# Patient Record
Sex: Female | Born: 1985 | Race: White | Hispanic: No | Marital: Married | Smoking: Never smoker
Health system: Southern US, Community
[De-identification: ages and names within clinical notes are randomized; demographics above are authoritative.]

## PROBLEM LIST (undated history)

## (undated) ENCOUNTER — Inpatient Hospital Stay (HOSPITAL_COMMUNITY): Payer: Self-pay

## (undated) DIAGNOSIS — Z789 Other specified health status: Secondary | ICD-10-CM

## (undated) DIAGNOSIS — O98212 Gonorrhea complicating pregnancy, second trimester: Secondary | ICD-10-CM

## (undated) HISTORY — PX: NO PAST SURGERIES: SHX2092

---

## 2009-10-04 ENCOUNTER — Inpatient Hospital Stay (HOSPITAL_COMMUNITY): Admission: AD | Admit: 2009-10-04 | Discharge: 2009-10-05 | Payer: Self-pay | Admitting: Obstetrics & Gynecology

## 2009-10-05 ENCOUNTER — Inpatient Hospital Stay (HOSPITAL_COMMUNITY): Admission: AD | Admit: 2009-10-05 | Discharge: 2009-10-07 | Payer: Self-pay | Admitting: Obstetrics and Gynecology

## 2010-04-23 LAB — CBC
HCT: 31.2 % — ABNORMAL LOW (ref 36.0–46.0)
Hemoglobin: 10.7 g/dL — ABNORMAL LOW (ref 12.0–15.0)
MCH: 31.2 pg (ref 26.0–34.0)
MCH: 31.7 pg (ref 26.0–34.0)
MCHC: 34 g/dL (ref 30.0–36.0)
MCV: 91.7 fL (ref 78.0–100.0)
Platelets: 154 10*3/uL (ref 150–400)
Platelets: 164 10*3/uL (ref 150–400)
RBC: 4.21 MIL/uL (ref 3.87–5.11)
RDW: 14.3 % (ref 11.5–15.5)

## 2010-04-23 LAB — RH IMMUNE GLOB WKUP(>/=20WKS)(NOT WOMEN'S HOSP): Fetal Screen: NEGATIVE

## 2011-01-10 ENCOUNTER — Other Ambulatory Visit (HOSPITAL_COMMUNITY): Payer: Self-pay | Admitting: Obstetrics & Gynecology

## 2011-01-10 DIAGNOSIS — Z369 Encounter for antenatal screening, unspecified: Secondary | ICD-10-CM

## 2011-02-03 ENCOUNTER — Encounter (HOSPITAL_COMMUNITY): Payer: Self-pay

## 2011-02-03 ENCOUNTER — Ambulatory Visit (HOSPITAL_COMMUNITY)
Admission: RE | Admit: 2011-02-03 | Discharge: 2011-02-03 | Disposition: A | Discharge status: Home / Self Care | Payer: BC Managed Care – PPO | Source: Ambulatory Visit | Attending: Obstetrics & Gynecology | Admitting: Obstetrics & Gynecology

## 2011-02-03 DIAGNOSIS — O3510X0 Maternal care for (suspected) chromosomal abnormality in fetus, unspecified, not applicable or unspecified: Secondary | ICD-10-CM | POA: Insufficient documentation

## 2011-02-03 DIAGNOSIS — Z3689 Encounter for other specified antenatal screening: Secondary | ICD-10-CM | POA: Insufficient documentation

## 2011-02-03 DIAGNOSIS — O351XX0 Maternal care for (suspected) chromosomal abnormality in fetus, not applicable or unspecified: Secondary | ICD-10-CM | POA: Insufficient documentation

## 2011-02-03 DIAGNOSIS — Z369 Encounter for antenatal screening, unspecified: Secondary | ICD-10-CM

## 2011-02-08 NOTE — L&D Delivery Note (Signed)
Pt arrived by EMS having delivered en route.  Pt reports she broke her water shortly after 4pm and labor progessed rapidly.  Pt's placenta also delivered intact.  She had a 2nd degree tear repaired with a 2-0 and a 3-0 vicryl.  Fundus was firm.  EBL estimated to be about 300cc.  Female infant weighing 7#6 had APGARs of 7,9.  Pt was GBS +, nursery was alerted pt did not receive abx prophylaxis.

## 2011-02-28 ENCOUNTER — Other Ambulatory Visit: Payer: Self-pay

## 2011-03-14 LAB — OB RESULTS CONSOLE RPR: RPR: NONREACTIVE

## 2011-03-14 LAB — OB RESULTS CONSOLE ABO/RH: RH Type: NEGATIVE

## 2011-07-19 LAB — OB RESULTS CONSOLE GBS: GBS: POSITIVE

## 2011-08-01 ENCOUNTER — Inpatient Hospital Stay (HOSPITAL_COMMUNITY)
Admission: AD | Admit: 2011-08-01 | Discharge: 2011-08-01 | Disposition: A | Discharge status: Home / Self Care | Payer: BC Managed Care – PPO | Source: Ambulatory Visit | Attending: Obstetrics and Gynecology | Admitting: Obstetrics and Gynecology

## 2011-08-01 ENCOUNTER — Inpatient Hospital Stay (HOSPITAL_COMMUNITY)
Admission: AD | Admit: 2011-08-01 | Discharge: 2011-08-03 | DRG: 376 | Disposition: A | Discharge status: Home / Self Care | Payer: BC Managed Care – PPO | Source: Ambulatory Visit | Attending: Obstetrics and Gynecology | Admitting: Obstetrics and Gynecology

## 2011-08-01 ENCOUNTER — Encounter (HOSPITAL_COMMUNITY): Payer: Self-pay | Admitting: *Deleted

## 2011-08-01 ENCOUNTER — Encounter (HOSPITAL_COMMUNITY): Payer: Self-pay

## 2011-08-01 DIAGNOSIS — O479 False labor, unspecified: Secondary | ICD-10-CM | POA: Insufficient documentation

## 2011-08-01 HISTORY — DX: Other specified health status: Z78.9

## 2011-08-01 LAB — CBC
Hemoglobin: 11.2 g/dL — ABNORMAL LOW (ref 12.0–15.0)
MCHC: 33.1 g/dL (ref 30.0–36.0)
RDW: 13.9 % (ref 11.5–15.5)
WBC: 7.8 10*3/uL (ref 4.0–10.5)

## 2011-08-01 LAB — RPR: RPR Ser Ql: NONREACTIVE

## 2011-08-01 MED ORDER — ONDANSETRON HCL 4 MG/2ML IJ SOLN: 4.0000 mg | Freq: Four times a day (QID) | INTRAMUSCULAR | Status: DC | PRN

## 2011-08-01 MED ORDER — DIPHENHYDRAMINE HCL 25 MG PO CAPS: 25.0000 mg | ORAL_CAPSULE | Freq: Four times a day (QID) | ORAL | Status: DC | PRN

## 2011-08-01 MED ORDER — IBUPROFEN 600 MG PO TABS: 600.0000 mg | ORAL_TABLET | Freq: Four times a day (QID) | ORAL | Status: DC | PRN

## 2011-08-01 MED ORDER — OXYTOCIN BOLUS FROM INFUSION
250.0000 mL | Freq: Once | INTRAVENOUS | Status: AC
  Administered 2011-08-01: 250 mL via INTRAVENOUS

## 2011-08-01 MED ORDER — LACTATED RINGERS IV SOLN: 500.0000 mL | INTRAVENOUS | Status: DC | PRN

## 2011-08-01 MED ORDER — OXYTOCIN 40 UNITS IN LACTATED RINGERS INFUSION - SIMPLE MED
INTRAVENOUS | Status: AC
  Filled 2011-08-01: qty 1000

## 2011-08-01 MED ORDER — ZOLPIDEM TARTRATE 5 MG PO TABS: 5.0000 mg | ORAL_TABLET | Freq: Every evening | ORAL | Status: DC | PRN

## 2011-08-01 MED ORDER — LIDOCAINE HCL (PF) 1 % IJ SOLN
30.0000 mL | INTRAMUSCULAR | Status: DC | PRN
  Administered 2011-08-01: 30 mL via SUBCUTANEOUS
  Filled 2011-08-01: qty 30

## 2011-08-01 MED ORDER — LACTATED RINGERS IV SOLN: INTRAVENOUS | Status: DC

## 2011-08-01 MED ORDER — CITRIC ACID-SODIUM CITRATE 334-500 MG/5ML PO SOLN: 30.0000 mL | ORAL | Status: DC | PRN

## 2011-08-01 MED ORDER — SIMETHICONE 80 MG PO CHEW: 80.0000 mg | CHEWABLE_TABLET | ORAL | Status: DC | PRN

## 2011-08-01 MED ORDER — FLEET ENEMA 7-19 GM/118ML RE ENEM: 1.0000 | ENEMA | RECTAL | Status: DC | PRN

## 2011-08-01 MED ORDER — LANOLIN HYDROUS EX OINT: TOPICAL_OINTMENT | CUTANEOUS | Status: DC | PRN

## 2011-08-01 MED ORDER — OXYCODONE-ACETAMINOPHEN 5-325 MG PO TABS: 1.0000 | ORAL_TABLET | ORAL | Status: DC | PRN

## 2011-08-01 MED ORDER — ACETAMINOPHEN 325 MG PO TABS: 650.0000 mg | ORAL_TABLET | ORAL | Status: DC | PRN

## 2011-08-01 MED ORDER — SENNOSIDES-DOCUSATE SODIUM 8.6-50 MG PO TABS
2.0000 | ORAL_TABLET | Freq: Every day | ORAL | Status: DC
  Administered 2011-08-01 – 2011-08-02 (×2): 2 via ORAL

## 2011-08-01 MED ORDER — ONDANSETRON HCL 4 MG/2ML IJ SOLN: 4.0000 mg | INTRAMUSCULAR | Status: DC | PRN

## 2011-08-01 MED ORDER — DIBUCAINE 1 % RE OINT: 1.0000 "application " | TOPICAL_OINTMENT | RECTAL | Status: DC | PRN

## 2011-08-01 MED ORDER — OXYTOCIN 40 UNITS IN LACTATED RINGERS INFUSION - SIMPLE MED
62.5000 mL/h | Freq: Once | INTRAVENOUS | Status: AC
  Administered 2011-08-01: 2.5 [IU]/h via INTRAVENOUS

## 2011-08-01 MED ORDER — TETANUS-DIPHTH-ACELL PERTUSSIS 5-2.5-18.5 LF-MCG/0.5 IM SUSP: 0.5000 mL | Freq: Once | INTRAMUSCULAR | Status: DC

## 2011-08-01 MED ORDER — PRENATAL MULTIVITAMIN CH
1.0000 | ORAL_TABLET | Freq: Every day | ORAL | Status: DC
  Administered 2011-08-02 – 2011-08-03 (×2): 1 via ORAL
  Filled 2011-08-01 (×2): qty 1

## 2011-08-01 MED ORDER — BENZOCAINE-MENTHOL 20-0.5 % EX AERO
1.0000 "application " | INHALATION_SPRAY | CUTANEOUS | Status: DC | PRN
  Administered 2011-08-01: 1 via TOPICAL
  Filled 2011-08-01: qty 56

## 2011-08-01 MED ORDER — IBUPROFEN 600 MG PO TABS
600.0000 mg | ORAL_TABLET | Freq: Four times a day (QID) | ORAL | Status: DC
  Administered 2011-08-01 – 2011-08-03 (×7): 600 mg via ORAL
  Filled 2011-08-01 (×7): qty 1

## 2011-08-01 MED ORDER — WITCH HAZEL-GLYCERIN EX PADS: 1.0000 "application " | MEDICATED_PAD | CUTANEOUS | Status: DC | PRN

## 2011-08-01 MED ORDER — ONDANSETRON HCL 4 MG PO TABS: 4.0000 mg | ORAL_TABLET | ORAL | Status: DC | PRN

## 2011-08-01 NOTE — MAU Note (Signed)
Pt reports uc's q 3 min 

## 2011-08-01 NOTE — Discharge Instructions (Signed)
Fetal Movement Counts Patient Name: __________________________________________________ Patient Due Date: ____________________ Kick counts is highly recommended in high risk pregnancies, but it is a good idea for every pregnant woman to do. Start counting fetal movements at 28 weeks of the pregnancy. Fetal movements increase after eating a full meal or eating or drinking something sweet (the blood sugar is higher). It is also important to drink plenty of fluids (well hydrated) before doing the count. Lie on your left side because it helps with the circulation or you can sit in a comfortable chair with your arms over your belly (abdomen) with no distractions around you. DOING THE COUNT  Try to do the count the same time of day each time you do it.   Mark the day and time, then see how long it takes for you to feel 10 movements (kicks, flutters, swishes, rolls). You should have at least 10 movements within 2 hours. You will most likely feel 10 movements in much less than 2 hours. If you do not, wait an hour and count again. After a couple of days you will see a pattern.   What you are looking for is a change in the pattern or not enough counts in 2 hours. Is it taking longer in time to reach 10 movements?  SEEK MEDICAL CARE IF:  You feel less than 10 counts in 2 hours. Tried twice.   No movement in one hour.   The pattern is changing or taking longer each day to reach 10 counts in 2 hours.   You feel the baby is not moving as it usually does.  Date: ____________ Movements: ____________ Start time: ____________ Finish time: ____________  Date: ____________ Movements: ____________ Start time: ____________ Finish time: ____________ Date: ____________ Movements: ____________ Start time: ____________ Finish time: ____________ Date: ____________ Movements: ____________ Start time: ____________ Finish time: ____________ Date: ____________ Movements: ____________ Start time: ____________ Finish time:  ____________ Date: ____________ Movements: ____________ Start time: ____________ Finish time: ____________ Date: ____________ Movements: ____________ Start time: ____________ Finish time: ____________ Date: ____________ Movements: ____________ Start time: ____________ Finish time: ____________  Date: ____________ Movements: ____________ Start time: ____________ Finish time: ____________ Date: ____________ Movements: ____________ Start time: ____________ Finish time: ____________ Date: ____________ Movements: ____________ Start time: ____________ Finish time: ____________ Date: ____________ Movements: ____________ Start time: ____________ Finish time: ____________ Date: ____________ Movements: ____________ Start time: ____________ Finish time: ____________ Date: ____________ Movements: ____________ Start time: ____________ Finish time: ____________ Date: ____________ Movements: ____________ Start time: ____________ Finish time: ____________  Date: ____________ Movements: ____________ Start time: ____________ Finish time: ____________ Date: ____________ Movements: ____________ Start time: ____________ Finish time: ____________ Date: ____________ Movements: ____________ Start time: ____________ Finish time: ____________ Date: ____________ Movements: ____________ Start time: ____________ Finish time: ____________ Date: ____________ Movements: ____________ Start time: ____________ Finish time: ____________ Date: ____________ Movements: ____________ Start time: ____________ Finish time: ____________ Date: ____________ Movements: ____________ Start time: ____________ Finish time: ____________  Date: ____________ Movements: ____________ Start time: ____________ Finish time: ____________ Date: ____________ Movements: ____________ Start time: ____________ Finish time: ____________ Date: ____________ Movements: ____________ Start time: ____________ Finish time: ____________ Date: ____________ Movements:  ____________ Start time: ____________ Finish time: ____________ Date: ____________ Movements: ____________ Start time: ____________ Finish time: ____________ Date: ____________ Movements: ____________ Start time: ____________ Finish time: ____________ Date: ____________ Movements: ____________ Start time: ____________ Finish time: ____________  Date: ____________ Movements: ____________ Start time: ____________ Finish time: ____________ Date: ____________ Movements: ____________ Start time: ____________ Finish time: ____________ Date: ____________ Movements: ____________ Start time:   ____________ Finish time: ____________ Date: ____________ Movements: ____________ Start time: ____________ Finish time: ____________ Date: ____________ Movements: ____________ Start time: ____________ Finish time: ____________ Date: ____________ Movements: ____________ Start time: ____________ Finish time: ____________ Date: ____________ Movements: ____________ Start time: ____________ Finish time: ____________  Date: ____________ Movements: ____________ Start time: ____________ Finish time: ____________ Date: ____________ Movements: ____________ Start time: ____________ Finish time: ____________ Date: ____________ Movements: ____________ Start time: ____________ Finish time: ____________ Date: ____________ Movements: ____________ Start time: ____________ Finish time: ____________ Date: ____________ Movements: ____________ Start time: ____________ Finish time: ____________ Date: ____________ Movements: ____________ Start time: ____________ Finish time: ____________ Date: ____________ Movements: ____________ Start time: ____________ Finish time: ____________  Date: ____________ Movements: ____________ Start time: ____________ Finish time: ____________ Date: ____________ Movements: ____________ Start time: ____________ Finish time: ____________ Date: ____________ Movements: ____________ Start time: ____________ Finish  time: ____________ Date: ____________ Movements: ____________ Start time: ____________ Finish time: ____________ Date: ____________ Movements: ____________ Start time: ____________ Finish time: ____________ Date: ____________ Movements: ____________ Start time: ____________ Finish time: ____________ Date: ____________ Movements: ____________ Start time: ____________ Finish time: ____________  Date: ____________ Movements: ____________ Start time: ____________ Finish time: ____________ Date: ____________ Movements: ____________ Start time: ____________ Finish time: ____________ Date: ____________ Movements: ____________ Start time: ____________ Finish time: ____________ Date: ____________ Movements: ____________ Start time: ____________ Finish time: ____________ Date: ____________ Movements: ____________ Start time: ____________ Finish time: ____________ Date: ____________ Movements: ____________ Start time: ____________ Finish time: ____________ Document Released: 02/23/2006 Document Revised: 01/13/2011 Document Reviewed: 08/26/2008 Exit[employer] Patient Information 2012 [employer].Braxton Hicks Contractions Pregnancy is commonly associated with contractions of the uterus throughout the pregnancy. Towards the end of pregnancy (32 to 34 weeks), these contractions (Braxton Hicks) can develop more often and may become more forceful. This is not true labor because these contractions do not result in opening (dilatation) and thinning of the cervix. They are sometimes difficult to tell apart from true labor because these contractions can be forceful and people have different pain tolerances. You should not feel embarrassed if you go to the hospital with false labor. Sometimes, the only way to tell if you are in true labor is for your caregiver to follow the changes in the cervix. How to tell the difference between true and false labor:  False labor.   The contractions of false labor are usually shorter,  irregular and not as hard as those of true labor.   They are often felt in the front of the lower abdomen and in the groin.   They may leave with walking around or changing positions while lying down.   They get weaker and are shorter lasting as time goes on.   These contractions are usually irregular.   They do not usually become progressively stronger, regular and closer together as with true labor.   True labor.   Contractions in true labor last 30 to 70 seconds, become very regular, usually become more intense, and increase in frequency.   They do not go away with walking.   The discomfort is usually felt in the top of the uterus and spreads to the lower abdomen and low back.   True labor can be determined by your caregiver with an exam. This will show that the cervix is dilating and getting thinner.  If there are no prenatal problems or other health problems associated with the pregnancy, it is completely safe to be sent home with false labor and await the onset of true labor. HOME CARE INSTRUCTIONS   Keep up   with your usual exercises and instructions.   Take medications as directed.   Keep your regular prenatal appointment.   Eat and drink lightly if you think you are going into labor.   If BH contractions are making you uncomfortable:   Change your activity position from lying down or resting to walking/walking to resting.   Sit and rest in a tub of warm water.   Drink 2 to 3 glasses of water. Dehydration may cause B-H contractions.   Do slow and deep breathing several times an hour.  SEEK IMMEDIATE MEDICAL CARE IF:   Your contractions continue to become stronger, more regular, and closer together.   You have a gushing, burst or leaking of fluid from the vagina.   An oral temperature above 102 F (38.9 C) develops.   You have passage of blood-tinged mucus.   You develop vaginal bleeding.   You develop continuous belly (abdominal) pain.   You have low  back pain that you never had before.   You feel the baby's head pushing down causing pelvic pressure.   The baby is not moving as much as it used to.  Document Released: 01/24/2005 Document Revised: 01/13/2011 Document Reviewed: 07/18/2008 Exit[employer] Patient Information 2012 [employer]. 

## 2011-08-02 LAB — TYPE AND SCREEN
ABO/RH(D): A NEG
DAT, IgG: NEGATIVE

## 2011-08-02 LAB — CBC
Hemoglobin: 9.9 g/dL — ABNORMAL LOW (ref 12.0–15.0)
MCHC: 33.1 g/dL (ref 30.0–36.0)
Platelets: 173 10*3/uL (ref 150–400)
RBC: 3.4 MIL/uL — ABNORMAL LOW (ref 3.87–5.11)

## 2011-08-02 MED ORDER — RHO D IMMUNE GLOBULIN 1500 UNIT/2ML IJ SOLN
300.0000 ug | Freq: Once | INTRAMUSCULAR | Status: AC
  Administered 2011-08-02: 300 ug via INTRAMUSCULAR
  Filled 2011-08-02: qty 2

## 2011-08-02 NOTE — Progress Notes (Signed)
PPD#1 Pt without c/o. Lochia-mild VSSAF IMP/ doing well Plan/ routine care, will circ baby today.

## 2011-08-03 LAB — RH IG WORKUP (INCLUDES ABO/RH)
ABO/RH(D): A NEG
Gestational Age(Wks): 38.2

## 2011-08-03 MED ORDER — HYDROCODONE-ACETAMINOPHEN 5-500 MG PO TABS: 1.0000 | ORAL_TABLET | ORAL | Status: AC | PRN

## 2011-08-03 MED ORDER — OXYCODONE-ACETAMINOPHEN 5-325 MG PO TABS: 2.0000 | ORAL_TABLET | ORAL | Status: AC | PRN

## 2011-08-03 NOTE — Discharge Summary (Signed)
Obstetric Discharge Summary Reason for Admission: onset of labor Prenatal Procedures: ultrasound Intrapartum Procedures: Precipitous vaginal delivery in ambulance Postpartum Procedures: none Complications-Operative and Postpartum: 2nd degree perineal laceration Hemoglobin  Date Value Range Status  08/02/2011 9.9* 12.0 - 15.0 g/dL Final     HCT  Date Value Range Status  08/02/2011 29.9* 36.0 - 46.0 % Final    Physical Exam:  General: alert, cooperative and appears stated age 27: appropriate Uterine Fundus: firm   Discharge Diagnoses: Term Pregnancy-delivered  Discharge Information: Date: 08/03/2011 Activity: pelvic rest Diet: routine Medications: Ibuprofen and Vicodin Condition: stable Instructions: refer to practice specific booklet Discharge to: home Follow-up Information    Follow up with Almon Hercules., MD. Schedule an appointment as soon as possible for a visit in 4 weeks. (For a postpartum evaluation)    Contact information:   [address] [place] [address] [phone]          Newborn Data: Live born female  Birth Weight: 7 lb 6.7 oz (3365 g) APGAR: 7, 9  Home with mother.  Jacyln Carmer H. 08/03/2011, 9:24 AM

## 2011-08-06 NOTE — H&P (Signed)
26 y.o. [redacted]w[redacted]d  G2P2002 comes in after delivery en route to the hospital by EMS.  Pt's placenta was delivered shortly thereafter, examined and found to be intact. It was noted that a second degree perineal laceration was present which was repaired with 2-0 vicryl.  Past Medical History  Diagnosis Date  . No pertinent past medical history     Past Surgical History  Procedure Date  . No past surgeries     OB History as of 08/03/11    Grav Para Term Preterm Abortions TAB SAB Ect Mult Living   2 2 2  0 0 0 0 0 0 2     # Outc Date GA Lbr Len/2nd Wgt Sex Del Anes PTL Lv   1 TRM 6/13 [redacted]w[redacted]d 00:00 7lb6.7oz(3.365kg) M SVD None  Yes   Comments: delivered by ems   2 TRM     F SVD   Yes      History   Social History  . Marital Status: Married    Spouse Name: N/A    Number of Children: N/A  . Years of Education: N/A   Occupational History  . Not on file.   Social History Main Topics  . Smoking status: Never Smoker   . Smokeless tobacco: Never Used  . Alcohol Use: No  . Drug Use: No  . Sexually Active: Yes   Other Topics Concern  . Not on file   Social History Narrative  . No narrative on file   Review of patient's allergies indicates no known allergies.   Prenatal Course:  GBS+, Rh Neg  Filed Vitals:   08/03/11 0520  BP: 92/60  Pulse: 82  Temp: 98 F (36.7 C)  Resp: 18     Lungs/Cor:  NAD Abdomen:  soft, gravid Ex:  no cords, erythema    A/P  Admit for postpartum care  GBS Pos - nursery alerted that pt did not received GBS prophylaxis Other routine care  [place], [facility]

## 2012-11-07 LAB — OB RESULTS CONSOLE ABO/RH: RH TYPE: NEGATIVE

## 2012-11-07 LAB — OB RESULTS CONSOLE GC/CHLAMYDIA
Chlamydia: NEGATIVE
GC PROBE AMP, GENITAL: NEGATIVE

## 2012-11-07 LAB — OB RESULTS CONSOLE ANTIBODY SCREEN: Antibody Screen: NEGATIVE

## 2012-11-07 LAB — OB RESULTS CONSOLE RUBELLA ANTIBODY, IGM: Rubella: IMMUNE

## 2013-02-07 NOTE — L&D Delivery Note (Signed)
Patient was C/C/+2 and pushed for <10 minutes with epidural.   NSVD female infant, Apgars 8/9, weight pending.   The patient had a 2nd degree lac repaired with 3-0 vicryl. Fundus was firm. EBL was expected. Placenta was delivered intact. Vagina was clear.  Baby was vigorous and doing skin to skin with mother.  Hannah Shaffer, Hannah Shaffer

## 2013-05-06 LAB — OB RESULTS CONSOLE GBS: STREP GROUP B AG: NEGATIVE

## 2013-05-06 LAB — OB RESULTS CONSOLE GC/CHLAMYDIA
Chlamydia: NEGATIVE
GC PROBE AMP, GENITAL: NEGATIVE

## 2013-05-14 ENCOUNTER — Encounter (HOSPITAL_COMMUNITY): Payer: Self-pay | Admitting: *Deleted

## 2013-05-14 ENCOUNTER — Inpatient Hospital Stay (HOSPITAL_COMMUNITY)
Admission: AD | Admit: 2013-05-14 | Discharge: 2013-05-15 | DRG: 775 | Disposition: A | Discharge status: Home / Self Care | Payer: 59 | Source: Ambulatory Visit | Attending: Obstetrics & Gynecology | Admitting: Obstetrics & Gynecology

## 2013-05-14 ENCOUNTER — Encounter (HOSPITAL_COMMUNITY): Payer: 59 | Admitting: Anesthesiology

## 2013-05-14 ENCOUNTER — Inpatient Hospital Stay (HOSPITAL_COMMUNITY): Payer: 59 | Admitting: Anesthesiology

## 2013-05-14 DIAGNOSIS — O360990 Maternal care for other rhesus isoimmunization, unspecified trimester, not applicable or unspecified: Secondary | ICD-10-CM | POA: Diagnosis present

## 2013-05-14 DIAGNOSIS — IMO0001 Reserved for inherently not codable concepts without codable children: Secondary | ICD-10-CM

## 2013-05-14 HISTORY — DX: Gonorrhea complicating pregnancy, second trimester: O98.212

## 2013-05-14 LAB — CBC
HCT: 36 % (ref 36.0–46.0)
Hemoglobin: 11.8 g/dL — ABNORMAL LOW (ref 12.0–15.0)
MCH: 28.5 pg (ref 26.0–34.0)
MCHC: 32.8 g/dL (ref 30.0–36.0)
MCV: 87 fL (ref 78.0–100.0)
PLATELETS: 178 10*3/uL (ref 150–400)
RBC: 4.14 MIL/uL (ref 3.87–5.11)
RDW: 14.5 % (ref 11.5–15.5)
WBC: 6.5 10*3/uL (ref 4.0–10.5)

## 2013-05-14 LAB — OB RESULTS CONSOLE HIV ANTIBODY (ROUTINE TESTING): HIV: NONREACTIVE

## 2013-05-14 LAB — RAPID HIV SCREEN (WH-MAU): Rapid HIV Screen: NONREACTIVE

## 2013-05-14 LAB — POCT FERN TEST: POCT FERN TEST: NEGATIVE

## 2013-05-14 LAB — RPR: RPR: NONREACTIVE

## 2013-05-14 MED ORDER — LACTATED RINGERS IV SOLN
INTRAVENOUS | Status: DC
  Administered 2013-05-14 (×2): via INTRAVENOUS

## 2013-05-14 MED ORDER — FENTANYL 2.5 MCG/ML BUPIVACAINE 1/10 % EPIDURAL INFUSION (WH - ANES)
14.0000 mL/h | INTRAMUSCULAR | Status: DC | PRN
  Filled 2013-05-14: qty 125

## 2013-05-14 MED ORDER — SIMETHICONE 80 MG PO CHEW: 80.0000 mg | CHEWABLE_TABLET | ORAL | Status: DC | PRN

## 2013-05-14 MED ORDER — ONDANSETRON HCL 4 MG/2ML IJ SOLN: 4.0000 mg | Freq: Four times a day (QID) | INTRAMUSCULAR | Status: DC | PRN

## 2013-05-14 MED ORDER — FENTANYL 2.5 MCG/ML BUPIVACAINE 1/10 % EPIDURAL INFUSION (WH - ANES)
INTRAMUSCULAR | Status: DC | PRN
  Administered 2013-05-14: 14 mL/h via EPIDURAL

## 2013-05-14 MED ORDER — ZOLPIDEM TARTRATE 5 MG PO TABS: 5.0000 mg | ORAL_TABLET | Freq: Every evening | ORAL | Status: DC | PRN

## 2013-05-14 MED ORDER — LACTATED RINGERS IV SOLN: 500.0000 mL | INTRAVENOUS | Status: DC | PRN

## 2013-05-14 MED ORDER — TETANUS-DIPHTH-ACELL PERTUSSIS 5-2.5-18.5 LF-MCG/0.5 IM SUSP: 0.5000 mL | Freq: Once | INTRAMUSCULAR | Status: DC

## 2013-05-14 MED ORDER — BENZOCAINE-MENTHOL 20-0.5 % EX AERO
1.0000 "application " | INHALATION_SPRAY | CUTANEOUS | Status: DC | PRN
  Administered 2013-05-14: 1 via TOPICAL
  Filled 2013-05-14 (×2): qty 56

## 2013-05-14 MED ORDER — FLEET ENEMA 7-19 GM/118ML RE ENEM: 1.0000 | ENEMA | RECTAL | Status: DC | PRN

## 2013-05-14 MED ORDER — OXYCODONE-ACETAMINOPHEN 5-325 MG PO TABS: 1.0000 | ORAL_TABLET | ORAL | Status: DC | PRN

## 2013-05-14 MED ORDER — LANOLIN HYDROUS EX OINT: TOPICAL_OINTMENT | CUTANEOUS | Status: DC | PRN

## 2013-05-14 MED ORDER — OXYTOCIN 40 UNITS IN LACTATED RINGERS INFUSION - SIMPLE MED
62.5000 mL/h | INTRAVENOUS | Status: DC
  Administered 2013-05-14: 62.5 mL/h via INTRAVENOUS
  Filled 2013-05-14: qty 1000

## 2013-05-14 MED ORDER — PHENYLEPHRINE 40 MCG/ML (10ML) SYRINGE FOR IV PUSH (FOR BLOOD PRESSURE SUPPORT): 80.0000 ug | PREFILLED_SYRINGE | INTRAVENOUS | Status: DC | PRN

## 2013-05-14 MED ORDER — PRENATAL MULTIVITAMIN CH
1.0000 | ORAL_TABLET | Freq: Every day | ORAL | Status: DC
  Administered 2013-05-15: 1 via ORAL
  Filled 2013-05-14: qty 1

## 2013-05-14 MED ORDER — LIDOCAINE HCL (PF) 1 % IJ SOLN
30.0000 mL | INTRAMUSCULAR | Status: DC | PRN
  Filled 2013-05-14: qty 30

## 2013-05-14 MED ORDER — LACTATED RINGERS IV SOLN
500.0000 mL | Freq: Once | INTRAVENOUS | Status: DC
Start: 2013-05-14 — End: 2013-05-14

## 2013-05-14 MED ORDER — ONDANSETRON HCL 4 MG PO TABS: 4.0000 mg | ORAL_TABLET | ORAL | Status: DC | PRN

## 2013-05-14 MED ORDER — PHENYLEPHRINE 40 MCG/ML (10ML) SYRINGE FOR IV PUSH (FOR BLOOD PRESSURE SUPPORT)
80.0000 ug | PREFILLED_SYRINGE | INTRAVENOUS | Status: DC | PRN
Start: 2013-05-14 — End: 2013-05-14
  Filled 2013-05-14: qty 10

## 2013-05-14 MED ORDER — CITRIC ACID-SODIUM CITRATE 334-500 MG/5ML PO SOLN: 30.0000 mL | ORAL | Status: DC | PRN

## 2013-05-14 MED ORDER — DIPHENHYDRAMINE HCL 50 MG/ML IJ SOLN: 12.5000 mg | INTRAMUSCULAR | Status: DC | PRN

## 2013-05-14 MED ORDER — DIPHENHYDRAMINE HCL 25 MG PO CAPS: 25.0000 mg | ORAL_CAPSULE | Freq: Four times a day (QID) | ORAL | Status: DC | PRN

## 2013-05-14 MED ORDER — LIDOCAINE HCL (PF) 1 % IJ SOLN
INTRAMUSCULAR | Status: DC | PRN
  Administered 2013-05-14 (×2): 8 mL

## 2013-05-14 MED ORDER — SENNOSIDES-DOCUSATE SODIUM 8.6-50 MG PO TABS
2.0000 | ORAL_TABLET | ORAL | Status: DC
  Administered 2013-05-14: 2 via ORAL
  Filled 2013-05-14: qty 2

## 2013-05-14 MED ORDER — EPHEDRINE 5 MG/ML INJ
10.0000 mg | INTRAVENOUS | Status: DC | PRN
  Filled 2013-05-14: qty 4

## 2013-05-14 MED ORDER — ONDANSETRON HCL 4 MG/2ML IJ SOLN: 4.0000 mg | INTRAMUSCULAR | Status: DC | PRN

## 2013-05-14 MED ORDER — WITCH HAZEL-GLYCERIN EX PADS: 1.0000 "application " | MEDICATED_PAD | CUTANEOUS | Status: DC | PRN

## 2013-05-14 MED ORDER — IBUPROFEN 600 MG PO TABS
600.0000 mg | ORAL_TABLET | Freq: Four times a day (QID) | ORAL | Status: DC
  Administered 2013-05-14 – 2013-05-15 (×4): 600 mg via ORAL
  Filled 2013-05-14 (×4): qty 1

## 2013-05-14 MED ORDER — EPHEDRINE 5 MG/ML INJ: 10.0000 mg | INTRAVENOUS | Status: DC | PRN

## 2013-05-14 MED ORDER — OXYTOCIN BOLUS FROM INFUSION: 500.0000 mL | INTRAVENOUS | Status: DC

## 2013-05-14 MED ORDER — OXYCODONE-ACETAMINOPHEN 5-325 MG PO TABS
1.0000 | ORAL_TABLET | ORAL | Status: DC | PRN
Start: 2013-05-14 — End: 2013-05-15

## 2013-05-14 MED ORDER — ACETAMINOPHEN 325 MG PO TABS: 650.0000 mg | ORAL_TABLET | ORAL | Status: DC | PRN

## 2013-05-14 MED ORDER — DIBUCAINE 1 % RE OINT
1.0000 "application " | TOPICAL_OINTMENT | RECTAL | Status: DC | PRN
  Filled 2013-05-14: qty 28

## 2013-05-14 MED ORDER — IBUPROFEN 600 MG PO TABS
600.0000 mg | ORAL_TABLET | Freq: Four times a day (QID) | ORAL | Status: DC | PRN
  Administered 2013-05-14: 600 mg via ORAL
  Filled 2013-05-14: qty 1

## 2013-05-14 NOTE — H&P (Signed)
Hannah SprangLindsay B Clonch is a 28 y.o. female presenting for  Regular contractions that started approx 1-2 hours ago. She also reports having some  Bloody show at home when wiping. She is concerned as she had a extra-mural delivery with her last pregnancy and her labor course historically Has been fast. She notes good fetal movements. SROM ruled out in MAU. She is Rh- and received Rhogam at 28 weeks in the office.    Maternal Medical History:  Reason for admission: Contractions.   Contractions: Onset was 1-2 hours ago.   Frequency: regular.   Duration is approximately 1 minute.   Perceived severity is strong.    Fetal activity: Perceived fetal activity is normal.   Last perceived fetal movement was within the past 12 hours.    Prenatal complications: Bleeding.     OB History   Grav Para Term Preterm Abortions TAB SAB Ect Mult Living   3 2 2  0 0 0 0 0 0 2     Past Medical History  Diagnosis Date  . No pertinent past medical history    Past Surgical History  Procedure Laterality Date  . No past surgeries     Family History: family history is negative for Other. Social History:  reports that she has never smoked. She has never used smokeless tobacco. She reports that she does not drink alcohol or use illicit drugs.   Prenatal Transfer Tool  Maternal Diabetes: No Genetic Screening: Normal Maternal Ultrasounds/Referrals: Normal Fetal Ultrasounds or other Referrals:  None Maternal Substance Abuse:  No Significant Maternal Medications:  None Significant Maternal Lab Results:  None Other Comments:  None  Review of Systems  Constitutional: Negative for fever.  Eyes: Negative for blurred vision.  Cardiovascular: Negative for chest pain.  Neurological: Negative for dizziness and headaches.  Endo/Heme/Allergies: Does not bruise/bleed easily.  All other systems reviewed and are negative.    Dilation: 5.5 Effacement (%): 70 Station: -3 Exam by:: Sharen CounterLisa Leftwich Kirby CNM Blood  pressure 120/82, pulse 89, temperature 98.7 F (37.1 C), temperature source Oral, resp. rate 18, height 5\' 4"  (1.626 m), weight 62.596 kg (138 lb), SpO2 99.00%, unknown if currently breastfeeding. Maternal Exam:  Uterine Assessment: Contraction strength is moderate.  Contraction duration is 1 minute. Contraction frequency is regular.   Abdomen: Patient reports no abdominal tenderness. Fundal height is 37.   Estimated fetal weight is 3000.   Fetal presentation: vertex  Introitus: Normal vulva. Normal vagina.  Ferning test: negative.  Nitrazine test: negative.  Pelvis: adequate for delivery.   Cervix: Cervix evaluated by digital exam.   By CNM: 5.5/70%/-2  FHT: Baseline 145 moderate variability accels no decels Cat I Toco: Q3-5 minutes  Physical Exam  Prenatal labs: ABO, Rh: A/Negative/-- (10/01 0000) Antibody: Negative (10/01 0000) Rubella: Immune (10/01 0000) RPR:   NR HBsAg:   NEG HIV:   NEG GBS: Negative (03/30 0000)   Assessment/Plan: G3P2 at 37 weeks in active labor  Admit to L&D Continuous monitoring Patient desires epidural  Expected NSVD   Essie HartINN, Dierre Crevier STACIA 05/14/2013, 5:35 AM

## 2013-05-14 NOTE — Lactation Note (Signed)
This note was copied from the chart of Hannah Marlow BaarsLindsay Leonor. Lactation Consultation Note  Patient Name: Hannah Shaffer WUJWJ'XToday's Date: 05/14/2013 Reason for consult: Initial assessment of this mom and baby 10 hours post-delivery.  Mom is experienced with breastfeeding her older children for 1 year each (1 and 143 yo now).  She reports that baby latched well after birth but has been sleepy this evening.  LC reviewed STS and cue feedings.  Mom states she knows how to hand express her colostrum and LC recommends frequent breastfeeding attempts and STS. LC encouraged review of Baby and Me pp 9, 14 and 20-25 for STS and BF information.LC encouraged review of Baby and Me pp 9, 14 and 20-25 for STS and BF information.    Maternal Data Formula Feeding for Exclusion: No Infant to breast within first hour of birth: Yes (initial LATCH score=10) Has patient been taught Hand Expression?: Yes (mom states that she knows how to hand express) Does the patient have breastfeeding experience prior to this delivery?: Yes  Feeding    LATCH Score/Interventions            Initial LATCH score=10          Lactation Tools Discussed/Used   STS, hand expression, cue feedings  Consult Status Consult Status: Follow-up Date: 05/15/13 Follow-up type: In-patient    Hannah Shaffer, Hannah Shaffer [facility] 05/14/2013, 9:14 PM

## 2013-05-14 NOTE — Anesthesia Procedure Notes (Signed)
Epidural Patient location during procedure: OB Start time: 05/14/2013 5:40 AM End time: 05/14/2013 5:46 AM  Staffing Anesthesiologist: Leilani AbleHATCHETT, Chauna Osoria Performed by: anesthesiologist   Preanesthetic Checklist Completed: patient identified, surgical consent, pre-op evaluation, timeout performed, IV checked, risks and benefits discussed and monitors and equipment checked  Epidural Patient position: sitting Prep: site prepped and draped and DuraPrep Patient monitoring: continuous pulse ox and blood pressure Approach: midline Location: L2-L3 Injection technique: LOR air  Needle:  Needle type: Tuohy  Needle gauge: 17 G Needle length: 9 cm and 9 Needle insertion depth: 5 cm cm Catheter type: closed end flexible Catheter size: 19 Gauge Catheter at skin depth: 10 cm Test dose: negative and Other  Assessment Sensory level: T9 Events: blood not aspirated, injection not painful, no injection resistance, negative IV test and no paresthesia  Additional Notes Reason for block:procedure for pain

## 2013-05-14 NOTE — Anesthesia Preprocedure Evaluation (Signed)

## 2013-05-14 NOTE — MAU Note (Signed)
Contractions every 2-3 minutes. Noticed some vaginal bleeding in the toilet when using the restroom, leaking some clear fluid. Hx of fast delivery.

## 2013-05-15 LAB — CBC
HEMATOCRIT: 32 % — AB (ref 36.0–46.0)
Hemoglobin: 10.5 g/dL — ABNORMAL LOW (ref 12.0–15.0)
MCH: 28.6 pg (ref 26.0–34.0)
MCHC: 32.8 g/dL (ref 30.0–36.0)
MCV: 87.2 fL (ref 78.0–100.0)
Platelets: 162 10*3/uL (ref 150–400)
RBC: 3.67 MIL/uL — ABNORMAL LOW (ref 3.87–5.11)
RDW: 14.6 % (ref 11.5–15.5)
WBC: 7.5 10*3/uL (ref 4.0–10.5)

## 2013-05-15 MED ORDER — OXYCODONE-ACETAMINOPHEN 5-325 MG PO TABS: 1.0000 | ORAL_TABLET | ORAL | Status: DC | PRN

## 2013-05-15 MED ORDER — RHO D IMMUNE GLOBULIN 1500 UNIT/2ML IJ SOLN
300.0000 ug | Freq: Once | INTRAMUSCULAR | Status: AC
  Administered 2013-05-15: 300 ug via INTRAMUSCULAR
  Filled 2013-05-15: qty 2

## 2013-05-15 NOTE — Lactation Note (Signed)
This note was copied from the chart of Boy Marlow BaarsLindsay Mousseau. Lactation Consultation Note  Patient Name: Boy Marlow BaarsLindsay Beckom ZOXWR'UToday's Date: 05/15/2013   Visited with Mom, baby at 7623 hrs old.  Baby in CN for circumcision.  Mom describes baby latching well, and breast feeding often.  Recommended skin to skin, and cue based feedings.  Recommended at least 8 breast feedings per 24 hrs.  Tips to keep baby awake, alert, and feeding well when on the breast given. Denies nipple soreness, encouraged breast compression during feedings.  Engorgement prevention and treatment discussed.  Reminded Mom of OP Lactation services available to her.  Encouraged her to call prn.    Judee ClaraCaroline E Klaus Casteneda 05/15/2013, 10:49 AM

## 2013-05-15 NOTE — Anesthesia Postprocedure Evaluation (Signed)
  Anesthesia Post-op Note  Patient: Hannah Shaffer  Procedure(s) Performed: * No procedures listed *  Patient Location: Mother/Baby  Anesthesia Type:Epidural  Level of Consciousness: awake  Airway and Oxygen Therapy: Patient Spontanous Breathing  Post-op Pain: none  Post-op Assessment: Patient's Cardiovascular Status Stable, Respiratory Function Stable, Patent Airway, No signs of Nausea or vomiting, Adequate PO intake, Pain level controlled, No headache, No backache, No residual numbness and No residual motor weakness  Post-op Vital Signs: Reviewed and stable  Last Vitals:  Filed Vitals:   05/15/13 0455  BP: 94/60  Pulse: 69  Temp: 36.6 C  Resp: 18    Complications: No apparent anesthesia complications

## 2013-05-15 NOTE — Lactation Note (Signed)
This note was copied from the chart of Hannah Marlow BaarsLindsay Kainz. Lactation Consultation Note  F/U on baby d/t low birth weight. Mom states baby has been latching on well. Baby was getting ready to get a bath. 3rd baby, denies any difficulty or needs w/BF assistance. WH/LC brochure given w/resources, support groups and LC services.Encouraged to call for assistance if needed and to verify proper latch. Patient Name: Hannah Shaffer ZOXWR'UToday's Date: 05/15/2013     Maternal Data    Feeding Feeding Type: Breast Fed Length of feed: 10 min  [facility]                      Lactation Tools Discussed/Used     Consult Status      Charyl DancerLaura G Liela Rylee 05/15/2013, 5:22 AM

## 2013-05-15 NOTE — Discharge Summary (Signed)
Obstetric Discharge Summary Reason for Admission: onset of labor Prenatal Procedures: none Intrapartum Procedures: spontaneous vaginal delivery Postpartum Procedures: Rho(D) Ig Complications-Operative and Postpartum: 2 degree perineal laceration Hemoglobin  Date Value Ref Range Status  05/15/2013 10.5* 12.0 - 15.0 g/dL Final     HCT  Date Value Ref Range Status  05/15/2013 32.0* 36.0 - 46.0 % Final    Discharge Diagnoses: Term Pregnancy-delivered  Discharge Information: Date: 05/15/2013 Activity: pelvic rest Diet: routine Medications: Ibuprofen and Iron Condition: stable Instructions: refer to practice specific booklet Discharge to: home Follow-up Information   Follow up with CALLAHAN, SIDNEY, DO In 4 weeks.   Specialty:  Obstetrics and Gynecology   Contact information:   [address] [place] [address] [phone]       Newborn Data: Live born female  Birth Weight: 5 lb 14.2 oz (2670 g) APGAR: 8, 9  Home with mother.  Hannah Shaffer 05/15/2013, 8:13 AM

## 2013-05-15 NOTE — Progress Notes (Signed)
PPD#1 Pt without complaints. Would like a discharge today. Circ done IMP/ doing well Plan/ Will discharge.

## 2013-05-16 LAB — RH IG WORKUP (INCLUDES ABO/RH)
ABO/RH(D): A NEG
Antibody Screen: POSITIVE
DAT, IgG: NEGATIVE
Fetal Screen: NEGATIVE
Gestational Age(Wks): 37.1
UNIT DIVISION: 0

## 2013-12-09 ENCOUNTER — Encounter (HOSPITAL_COMMUNITY): Payer: Self-pay | Admitting: *Deleted

## 2014-05-05 ENCOUNTER — Encounter (HOSPITAL_COMMUNITY): Payer: Self-pay | Admitting: *Deleted

## 2014-05-05 ENCOUNTER — Inpatient Hospital Stay (HOSPITAL_COMMUNITY)
Admission: AD | Admit: 2014-05-05 | Discharge: 2014-05-05 | Disposition: A | Discharge status: Home / Self Care | Payer: 59 | Source: Ambulatory Visit | Attending: Obstetrics | Admitting: Obstetrics

## 2014-05-05 ENCOUNTER — Inpatient Hospital Stay (HOSPITAL_COMMUNITY): Payer: 59

## 2014-05-05 DIAGNOSIS — Z3A01 Less than 8 weeks gestation of pregnancy: Secondary | ICD-10-CM | POA: Diagnosis not present

## 2014-05-05 DIAGNOSIS — O360111 Maternal care for anti-D [Rh] antibodies, first trimester, fetus 1: Secondary | ICD-10-CM | POA: Diagnosis not present

## 2014-05-05 DIAGNOSIS — O4690 Antepartum hemorrhage, unspecified, unspecified trimester: Secondary | ICD-10-CM | POA: Diagnosis present

## 2014-05-05 DIAGNOSIS — O4691 Antepartum hemorrhage, unspecified, first trimester: Secondary | ICD-10-CM | POA: Insufficient documentation

## 2014-05-05 DIAGNOSIS — O209 Hemorrhage in early pregnancy, unspecified: Secondary | ICD-10-CM

## 2014-05-05 LAB — CBC WITH DIFFERENTIAL/PLATELET
Basophils Absolute: 0 10*3/uL (ref 0.0–0.1)
Basophils Relative: 0 % (ref 0–1)
EOS PCT: 1 % (ref 0–5)
Eosinophils Absolute: 0.1 10*3/uL (ref 0.0–0.7)
HCT: 38.8 % (ref 36.0–46.0)
Hemoglobin: 13 g/dL (ref 12.0–15.0)
LYMPHS ABS: 1.6 10*3/uL (ref 0.7–4.0)
Lymphocytes Relative: 26 % (ref 12–46)
MCH: 28.8 pg (ref 26.0–34.0)
MCHC: 33.5 g/dL (ref 30.0–36.0)
MCV: 85.8 fL (ref 78.0–100.0)
Monocytes Absolute: 0.6 10*3/uL (ref 0.1–1.0)
Monocytes Relative: 9 % (ref 3–12)
Neutro Abs: 4 10*3/uL (ref 1.7–7.7)
Neutrophils Relative %: 64 % (ref 43–77)
Platelets: 217 10*3/uL (ref 150–400)
RBC: 4.52 MIL/uL (ref 3.87–5.11)
RDW: 13.3 % (ref 11.5–15.5)
WBC: 6.2 10*3/uL (ref 4.0–10.5)

## 2014-05-05 LAB — URINALYSIS, ROUTINE W REFLEX MICROSCOPIC
Bilirubin Urine: NEGATIVE
GLUCOSE, UA: NEGATIVE mg/dL
Ketones, ur: NEGATIVE mg/dL
LEUKOCYTES UA: NEGATIVE
NITRITE: NEGATIVE
PH: 7.5 (ref 5.0–8.0)
PROTEIN: NEGATIVE mg/dL
Specific Gravity, Urine: 1.015 (ref 1.005–1.030)
UROBILINOGEN UA: 0.2 mg/dL (ref 0.0–1.0)

## 2014-05-05 LAB — URINE MICROSCOPIC-ADD ON

## 2014-05-05 LAB — POCT PREGNANCY, URINE: PREG TEST UR: POSITIVE — AB

## 2014-05-05 LAB — HCG, QUANTITATIVE, PREGNANCY: hCG, Beta Chain, Quant, S: 5360 m[IU]/mL — ABNORMAL HIGH (ref <5)

## 2014-05-05 MED ORDER — RHO D IMMUNE GLOBULIN 1500 UNIT/2ML IJ SOSY
300.0000 ug | PREFILLED_SYRINGE | Freq: Once | INTRAMUSCULAR | Status: AC
  Administered 2014-05-05: 300 ug via INTRAMUSCULAR
  Filled 2014-05-05: qty 2

## 2014-05-05 NOTE — MAU Provider Note (Signed)
History     CSN: [id number]  Arrival date and time: 05/05/14 1243   First Provider Initiated Contact with Patient 05/05/14 1328      Chief Complaint  Patient presents with  . Possible Pregnancy  . Vaginal Bleeding   HPI  [name] is a 29 y.o. [phone] at [redacted]w[redacted]d who presents to MAU today with complaint of vaginal bleeding since Thursday of last week. The patient states no period since prior to her last child who was born in April of last year. She has been breastfeeding since his delivery. She states that bleeding started as spotting and now is similar to a period. She denies abdominal pain, N/V/D or constipation, vaginal discharge or UTI symptoms. She states +HPT last week.   OB History    Gravida Para Term Preterm AB TAB SAB Ectopic Multiple Living   0 0 0 0 0 0 3      Past Medical History  Diagnosis Date  . No pertinent past medical history   . Gonorrhea complicating pregnancy in second trimester     Past Surgical History  Procedure Laterality Date  . No past surgeries      Family History  Problem Relation Age of Onset  . Other Neg Hx   . Heart disease Father 79    History  Substance Use Topics  . Smoking status: Never Smoker   . Smokeless tobacco: Never Used  . Alcohol Use: No    Allergies: No Known Allergies  Prescriptions prior to admission  Medication Sig Dispense Refill Last Dose  . oxyCODONE-acetaminophen (PERCOCET/ROXICET) 5-325 MG per tablet Take 1-2 tablets by mouth every 4 (four) hours as needed for severe pain (moderate - severe pain). (Patient not taking: Reported on 05/05/2014) 30 tablet 0   . Prenatal Vit-Fe Fumarate-FA (PRENATAL MULTIVITAMIN) TABS tablet Take 1 tablet by mouth daily at 12 noon.   Past Week at Unknown time    Review of Systems  Constitutional: Negative for fever and malaise/fatigue.  Gastrointestinal: Negative for nausea, vomiting, abdominal pain, diarrhea and constipation.  Genitourinary: Negative for dysuria,  urgency and frequency.       + vaginal bleeding   Physical Exam   Blood pressure 117/74, pulse 91, temperature 98.4 F (36.9 C), temperature source Oral, resp. rate 16, height  (1.6 m), weight 116 lb (52.617 kg), currently breastfeeding.  Physical Exam  Constitutional: She is oriented to person, place, and time. She appears well-developed and well-nourished. No distress.  HENT:  Head: Normocephalic.  Cardiovascular: Normal rate.   Respiratory: Effort normal.  GI: Soft. She exhibits no distension and no mass. There is no tenderness. There is no rebound and no guarding.  Genitourinary: Uterus is not enlarged and not tender. Cervix exhibits no motion tenderness, no discharge and no friability. Right adnexum displays no mass and no tenderness. Left adnexum displays no mass and no tenderness. There is bleeding (small amount of blood noted in the vaginal vault) in the vagina.  Neurological: She is alert and oriented to person, place, and time.  Skin: Skin is warm and dry. No erythema.  Psychiatric: She has a normal mood and affect.   Results for orders placed or performed during the hospital encounter of 05/05/14 (from the past 24 hour(s))  Urinalysis, Routine w reflex microscopic     Status: Abnormal   Collection Time: 05/05/14  1:00 PM  Result Value Ref Range   Color, Urine YELLOW YELLOW   APPearance CLEAR CLEAR  Specific Gravity, Urine 1.015 1.005 - 1.030   pH 7.5 5.0 - 8.0   Glucose, UA NEGATIVE NEGATIVE mg/dL   Hgb urine dipstick MODERATE (A) NEGATIVE   Bilirubin Urine NEGATIVE NEGATIVE   Ketones, ur NEGATIVE NEGATIVE mg/dL   Protein, ur NEGATIVE NEGATIVE mg/dL   Urobilinogen, UA 0.2 0.0 - 1.0 mg/dL   Nitrite NEGATIVE NEGATIVE   Leukocytes, UA NEGATIVE NEGATIVE  Urine microscopic-add on     Status: None   Collection Time: 05/05/14  1:00 PM  Result Value Ref Range   Squamous Epithelial / LPF RARE RARE   RBC / HPF 3-6 <3 RBC/hpf  Pregnancy, urine POC     Status: Abnormal    Collection Time: 05/05/14  1:05 PM  Result Value Ref Range   Preg Test, Ur POSITIVE (A) NEGATIVE  CBC with Differential/Platelet     Status: None   Collection Time: 05/05/14  2:00 PM  Result Value Ref Range   WBC 6.2 4.0 - 10.5 K/uL   RBC 4.52 3.87 - 5.11 MIL/uL   Hemoglobin 13.0 12.0 - 15.0 g/dL   HCT 16.138.8 09.636.0 - 04.546.0 %   MCV 85.8 78.0 - 100.0 fL   MCH 28.8 26.0 - 34.0 pg   MCHC 33.5 30.0 - 36.0 g/dL   RDW 40.913.3 81.111.5 - 91.415.5 %   Platelets 217 150 - 400 K/uL   Neutrophils Relative % 64 43 - 77 %   Neutro Abs 4.0 1.7 - 7.7 K/uL   Lymphocytes Relative 26 12 - 46 %   Lymphs Abs 1.6 0.7 - 4.0 K/uL   Monocytes Relative 9 3 - 12 %   Monocytes Absolute 0.6 0.1 - 1.0 K/uL   Eosinophils Relative 1 0 - 5 %   Eosinophils Absolute 0.1 0.0 - 0.7 K/uL   Basophils Relative 0 0 - 1 %   Basophils Absolute 0.0 0.0 - 0.1 K/uL  hCG, quantitative, pregnancy     Status: Abnormal   Collection Time: 05/05/14  2:00 PM  Result Value Ref Range   hCG, Beta Chain, Quant, S 5360 (H) <5 mIU/mL  Rh IG workup (includes ABO/Rh)     Status: None (Preliminary result)   Collection Time: 05/05/14  2:00 PM  Result Value Ref Range   Gestational Age(Wks) 6    ABO/RH(D) A NEG    Antibody Screen NEG    Unit Number [id number]    Blood Component Type RHIG    Unit division 00    Status of Unit ALLOCATED    Transfusion Status OK TO TRANSFUSE     [place] Ob Comp Less 14 Wks  05/05/2014   CLINICAL DATA:  Vaginal bleeding beginning 05/02/2014. Early pregnancy. Patient unsure of LMP.  EXAM: OBSTETRIC <14 WK US AND TRANSVAGINAL OB US  TECHNIQUE: Both transabdominal and transvaginal ultrasound examinations were performed for complete evaluation of the gestation as well as the maternal uterus, adnexal regions, and pelvic cul-de-sac. Transvaginal technique was performed to assess early pregnancy.  COMPARISON:  None.  FINDINGS: Intrauterine gestational sac: Present. Small in size in comparison with embryo size.  Yolk sac:   Present.  Embryo:  Present.  Cardiac Activity: Present.  Heart Rate: Heart rate appeared too slow to accurately measure with M-mode imaging.  CRL:  3.8  mm   6 w   1 d                  US EDC: 12/28/2014  Maternal uterus/adnexae: Small amount of subchorionic hemorrhage in small  volume pelvic free fluid or noted. Ovaries were normal in appearance, with a right ovarian corpus luteum noted.  IMPRESSION: Living intrauterine gestation as above. Gestational sac appears small and cardiac activity, while present, appears abnormally slow. Close clinical follow-up recommended with consideration for short-term repeat ultrasound.   Electronically Signed   By: Sebastian Ache   On: 05/05/2014 15:48   US Ob Transvaginal  05/05/2014   CLINICAL DATA:  Vaginal bleeding beginning 05/02/2014. Early pregnancy. Patient unsure of LMP.  EXAM: OBSTETRIC <14 WK [place] AND TRANSVAGINAL OB US  TECHNIQUE: Both transabdominal and transvaginal ultrasound examinations were performed for complete evaluation of the gestation as well as the maternal uterus, adnexal regions, and pelvic cul-de-sac. Transvaginal technique was performed to assess early pregnancy.  COMPARISON:  None.  FINDINGS: Intrauterine gestational sac: Present. Small in size in comparison with embryo size.  Yolk sac:  Present.  Embryo:  Present.  Cardiac Activity: Present.  Heart Rate: Heart rate appeared too slow to accurately measure with M-mode imaging.  CRL:  3.8  mm   6 w   1 d                  [place] EDC: 12/28/2014  Maternal uterus/adnexae: Small amount of subchorionic hemorrhage in small volume pelvic free fluid or noted. Ovaries were normal in appearance, with a right ovarian corpus luteum noted.  IMPRESSION: Living intrauterine gestation as above. Gestational sac appears small and cardiac activity, while present, appears abnormally slow. Close clinical follow-up recommended with consideration for short-term repeat ultrasound.   Electronically Signed   By: Sebastian Ache   On:  05/05/2014 15:48    MAU Course  Procedures None  MDM +UPT UA, CBC, quant hCG, Rhogam work-up, [place] today Rhogam given in MAU today Discussed patient with Dr. Chestine Spore. She recommends patient follow-up in the office in 2 weeks as scheduled or sooner if symptoms worsen  Assessment and Plan  A: SIUP at [redacted]w[redacted]d Vaginal bleeding in pregnancy prior to 22 weeks   P: Discharge home Bleeding precautions discussed Pelvic rest advised. Patient encouraged to avoid strenuous or high impact activity or heavy lifting Patient advised to follow-up in the office in 2 weeks as planned or sooner if symptoms worsen Patient may return to MAU as needed or if her condition were to change or worsen   Marny Lowenstein, PA-C  05/05/2014, 4:07 PM

## 2014-05-05 NOTE — MAU Note (Signed)
Found out preg on Wed.  Started spotting on Friday, more like a period on Sat. [facility] tech [facility] out, so was told to come here. Not really having any pain.

## 2014-05-05 NOTE — Discharge Instructions (Signed)
Threatened Miscarriage °A threatened miscarriage is when you have vaginal bleeding during your first 20 weeks of pregnancy but the pregnancy has not ended. Your doctor will do tests to make sure you are still pregnant. The cause of the bleeding may not be known. This condition does not mean your pregnancy will end. It does increase the risk of it ending (complete miscarriage). °HOME CARE  °· Make sure you keep all your doctor visits for prenatal care. °· Get plenty of rest. °· Do not have sex or use tampons if you have vaginal bleeding. °· Do not douche. °· Do not smoke or use drugs. °· Do not drink alcohol. °· Avoid caffeine. °GET HELP IF: °· You have light bleeding from your vagina. °· You have belly pain or cramping. °· You have a fever. °GET HELP RIGHT AWAY IF:  °· You have heavy bleeding from your vagina. °· You have clots of blood coming from your vagina. °· You have bad pain or cramps in your low back or belly. °· You have fever, chills, and bad belly pain. °MAKE SURE YOU:  °· Understand these instructions. °· Will watch your condition. °· Will get help right away if you are not doing well or get worse. °Document Released: 01/07/2008 Document Revised: 01/29/2013 Document Reviewed: 11/20/2012 °ExitCare® Patient Information ©2015 [employer]. This information is not intended to replace advice given to you by your health care provider. Make sure you discuss any questions you have with your health care provider. ° °

## 2014-05-06 LAB — RH IG WORKUP (INCLUDES ABO/RH)
ABO/RH(D): A NEG
ANTIBODY SCREEN: NEGATIVE
GESTATIONAL AGE(WKS): 6
Unit division: 0

## 2014-05-11 ENCOUNTER — Encounter (HOSPITAL_BASED_OUTPATIENT_CLINIC_OR_DEPARTMENT_OTHER): Payer: Self-pay

## 2014-05-11 ENCOUNTER — Emergency Department (HOSPITAL_BASED_OUTPATIENT_CLINIC_OR_DEPARTMENT_OTHER)
Admission: EM | Admit: 2014-05-11 | Discharge: 2014-05-11 | Disposition: A | Discharge status: Home / Self Care | Payer: 59 | Attending: Emergency Medicine | Admitting: Emergency Medicine

## 2014-05-11 DIAGNOSIS — Z3A01 Less than 8 weeks gestation of pregnancy: Secondary | ICD-10-CM | POA: Diagnosis not present

## 2014-05-11 DIAGNOSIS — O209 Hemorrhage in early pregnancy, unspecified: Secondary | ICD-10-CM | POA: Insufficient documentation

## 2014-05-11 DIAGNOSIS — R109 Unspecified abdominal pain: Secondary | ICD-10-CM | POA: Insufficient documentation

## 2014-05-11 DIAGNOSIS — M549 Dorsalgia, unspecified: Secondary | ICD-10-CM | POA: Diagnosis not present

## 2014-05-11 DIAGNOSIS — O212 Late vomiting of pregnancy: Secondary | ICD-10-CM | POA: Insufficient documentation

## 2014-05-11 DIAGNOSIS — O9989 Other specified diseases and conditions complicating pregnancy, childbirth and the puerperium: Secondary | ICD-10-CM | POA: Insufficient documentation

## 2014-05-11 DIAGNOSIS — R509 Fever, unspecified: Secondary | ICD-10-CM | POA: Diagnosis not present

## 2014-05-11 LAB — CBC WITH DIFFERENTIAL/PLATELET
BASOS ABS: 0 10*3/uL (ref 0.0–0.1)
BASOS PCT: 0 % (ref 0–1)
EOS PCT: 1 % (ref 0–5)
Eosinophils Absolute: 0.1 10*3/uL (ref 0.0–0.7)
HCT: 35.6 % — ABNORMAL LOW (ref 36.0–46.0)
Hemoglobin: 11.7 g/dL — ABNORMAL LOW (ref 12.0–15.0)
LYMPHS PCT: 17 % (ref 12–46)
Lymphs Abs: 1.3 10*3/uL (ref 0.7–4.0)
MCH: 28.5 pg (ref 26.0–34.0)
MCHC: 32.9 g/dL (ref 30.0–36.0)
MCV: 86.8 fL (ref 78.0–100.0)
MONOS PCT: 7 % (ref 3–12)
Monocytes Absolute: 0.5 10*3/uL (ref 0.1–1.0)
Neutro Abs: 5.8 10*3/uL (ref 1.7–7.7)
Neutrophils Relative %: 75 % (ref 43–77)
PLATELETS: 274 10*3/uL (ref 150–400)
RBC: 4.1 MIL/uL (ref 3.87–5.11)
RDW: 12.8 % (ref 11.5–15.5)
WBC: 7.7 10*3/uL (ref 4.0–10.5)

## 2014-05-11 LAB — URINALYSIS, ROUTINE W REFLEX MICROSCOPIC
Bilirubin Urine: NEGATIVE
Glucose, UA: NEGATIVE mg/dL
KETONES UR: NEGATIVE mg/dL
LEUKOCYTES UA: NEGATIVE
Nitrite: NEGATIVE
PH: 7 (ref 5.0–8.0)
Protein, ur: NEGATIVE mg/dL
SPECIFIC GRAVITY, URINE: 1.022 (ref 1.005–1.030)
Urobilinogen, UA: 0.2 mg/dL (ref 0.0–1.0)

## 2014-05-11 LAB — COMPREHENSIVE METABOLIC PANEL
ALBUMIN: 4.4 g/dL (ref 3.5–5.2)
ALK PHOS: 67 U/L (ref 39–117)
ALT: 19 U/L (ref 0–35)
AST: 25 U/L (ref 0–37)
Anion gap: 8 (ref 5–15)
BUN: 15 mg/dL (ref 6–23)
CO2: 29 mmol/L (ref 19–32)
Calcium: 9.4 mg/dL (ref 8.4–10.5)
Chloride: 101 mmol/L (ref 96–112)
Creatinine, Ser: 0.7 mg/dL (ref 0.50–1.10)
Glucose, Bld: 130 mg/dL — ABNORMAL HIGH (ref 70–99)
Potassium: 3.9 mmol/L (ref 3.5–5.1)
Sodium: 138 mmol/L (ref 135–145)
Total Protein: 7.7 g/dL (ref 6.0–8.3)

## 2014-05-11 LAB — URINE MICROSCOPIC-ADD ON

## 2014-05-11 LAB — HCG, QUANTITATIVE, PREGNANCY: hCG, Beta Chain, Quant, S: 205 m[IU]/mL — ABNORMAL HIGH (ref <5)

## 2014-05-11 NOTE — ED Provider Notes (Signed)
CSN: [id number]     Arrival date & time 05/11/14  1447 History  This chart was scribed for Doug Sou, MD by Tonye Royalty, ED Scribe. This patient was seen in room MH10/MH10 and the patient's care was started at 3:38 PM.    Chief Complaint  Patient presents with  . Miscarriage   The history is provided by the patient. No language interpreter was used.    HPI Comments: Hannah Shaffer is a 29 y.o. female who presents to the Emergency Department complaining of lower right back pain with onset at 1200. She reports associated subjective fever, chills, nausea, vomiting, and discomfort when urinating. She rates pain at 5/10 now, improved after Tylenol. She reports miscarriage 5 days ago, for which she was evaluated by Waynard Reeds. Since then she began having UTI symptoms including low back pain and urinary frequency for which she began taking medication 2 days ago. She states she is still having bleeding from the miscarriage but denies discharge. This was her 4th pregnancy. Last bowel movement as at 1200normal. Feels much improved now after treatment with Tylenol prior to coming here. No nausea presentShe denies smoking or drinking. She denies abdominal pain.  Past Medical History  Diagnosis Date  . No pertinent past medical history   . Gonorrhea complicating pregnancy in second trimester    Past Surgical History  Procedure Laterality Date  . No past surgeries     Family History  Problem Relation Age of Onset  . Other Neg Hx   . Heart disease Father 53   History  Substance Use Topics  . Smoking status: Never Smoker   . Smokeless tobacco: Never Used  . Alcohol Use: No   OB History    Gravida Para Term Preterm AB TAB SAB Ectopic Multiple Living   0 0 0 0 0 0 3     Review of Systems  Constitutional: Positive for fever and chills.  HENT: Negative.   Respiratory: Negative.   Cardiovascular: Negative.   Gastrointestinal: Positive for nausea and vomiting. Negative for abdominal  pain.  Genitourinary: Positive for dysuria, frequency and vaginal bleeding. Negative for vaginal discharge.  Musculoskeletal: Positive for back pain.  Skin: Negative.   Neurological: Negative.   Psychiatric/Behavioral: Negative.   All other systems reviewed and are negative.     Allergies  Review of patient's allergies indicates no known allergies.  Home Medications   Prior to Admission medications   Medication Sig Start Date End Date Taking? Authorizing Provider  Prenatal Vit-Fe Fumarate-FA (PRENATAL MULTIVITAMIN) TABS tablet Take 1 tablet by mouth daily at 12 noon.    Historical Provider, MD   BP 108/69 mmHg  Pulse 72  Temp(Src) 98.4 F (36.9 C) (Oral)  Resp 15  Ht  (1.626 m)  Wt 116 lb (52.617 kg)  BMI 19.90 kg/m2  SpO2 100%  LMP  Physical Exam  Constitutional: She appears well-developed and well-nourished.  HENT:  Head: Normocephalic and atraumatic.  Eyes: Conjunctivae are normal. Pupils are equal, round, and reactive to light.  Neck: Neck supple. No tracheal deviation present. No thyromegaly present.  Cardiovascular: Normal rate and regular rhythm.   No murmur heard. Pulmonary/Chest: Effort normal and breath sounds normal.  Abdominal: Soft. Bowel sounds are normal. She exhibits no distension. There is no tenderness.  Genitourinary:  Mild right flank tenderness  Musculoskeletal: Normal range of motion. She exhibits no edema or tenderness.  Neurological: She is alert. Coordination normal.  Skin: Skin is warm and  dry. No rash noted.  Psychiatric: She has a normal mood and affect.  Nursing note and vitals reviewed.   ED Course  Procedures (including critical care time)  DIAGNOSTIC STUDIES: Oxygen Saturation is 100% on room air, normal by my interpretation.    COORDINATION OF CARE: 3:38 PM Discussed treatment plan with patient at beside, the patient agrees with the plan and has no further questions at this time.   Labs Review Labs Reviewed   URINALYSIS, ROUTINE W REFLEX MICROSCOPIC    Imaging Review No results found.   EKG Interpretation None     5:10 PM patient continues to feel well. Has minimal discomfort at right flank.further history patient was placed on Macrobid which she started 2 days ago. No urine specimen was obtained at that time. Results for orders placed or performed during the hospital encounter of 05/11/14  Urinalysis, Routine w reflex microscopic  Result Value Ref Range   Color, Urine YELLOW YELLOW   APPearance CLEAR CLEAR   Specific Gravity, Urine 1.022 1.005 - 1.030   pH 7.0 5.0 - 8.0   Glucose, UA NEGATIVE NEGATIVE mg/dL   Hgb urine dipstick LARGE (A) NEGATIVE   Bilirubin Urine NEGATIVE NEGATIVE   Ketones, ur NEGATIVE NEGATIVE mg/dL   Protein, ur NEGATIVE NEGATIVE mg/dL   Urobilinogen, UA 0.2 0.0 - 1.0 mg/dL   Nitrite NEGATIVE NEGATIVE   Leukocytes, UA NEGATIVE NEGATIVE  Comprehensive metabolic panel  Result Value Ref Range   Sodium 138 135 - 145 mmol/L   Potassium 3.9 3.5 - 5.1 mmol/L   Chloride 101 96 - 112 mmol/L   CO2 29 19 - 32 mmol/L   Glucose, Bld 130 (H) 70 - 99 mg/dL   BUN 15 6 - 23 mg/dL   Creatinine, Ser 0.98 0.50 - 1.10 mg/dL   Calcium 9.4 8.4 - 11.9 mg/dL   Total Protein 7.7 6.0 - 8.3 g/dL   Albumin 4.4 3.5 - 5.2 g/dL   AST 25 0 - 37 U/L   ALT 19 0 - 35 U/L   Alkaline Phosphatase 67 39 - 117 U/L   Total Bilirubin <0.1 (L) 0.3 - 1.2 mg/dL   GFR calc non Af Amer >90 >90 mL/min   GFR calc Af Amer >90 >90 mL/min   Anion gap 8 5 - 15  CBC with Differential/Platelet  Result Value Ref Range   WBC 7.7 4.0 - 10.5 K/uL   RBC 4.10 3.87 - 5.11 MIL/uL   Hemoglobin 11.7 (L) 12.0 - 15.0 g/dL   HCT 14.7 (L) 82.9 - 56.2 %   MCV 86.8 78.0 - 100.0 fL   MCH 28.5 26.0 - 34.0 pg   MCHC 32.9 30.0 - 36.0 g/dL   RDW 13.0 86.5 - 78.4 %   Platelets 274 150 - 400 K/uL   Neutrophils Relative % 75 43 - 77 %   Neutro Abs 5.8 1.7 - 7.7 K/uL   Lymphocytes Relative 17 12 - 46 %   Lymphs Abs 1.3  0.7 - 4.0 K/uL   Monocytes Relative 7 3 - 12 %   Monocytes Absolute 0.5 0.1 - 1.0 K/uL   Eosinophils Relative 1 0 - 5 %   Eosinophils Absolute 0.1 0.0 - 0.7 K/uL   Basophils Relative 0 0 - 1 %   Basophils Absolute 0.0 0.0 - 0.1 K/uL  hCG, quantitative, pregnancy  Result Value Ref Range   hCG, Beta Chain, Quant, S 205 (H) <5 mIU/mL  Urine microscopic-add on  Result Value Ref Range  Squamous Epithelial / LPF RARE RARE   RBC / HPF TOO NUMEROUS TO COUNT <3 RBC/hpf   Bacteria, UA FEW (A) RARE   Urine-Other MUCOUS PRESENT    [place] Ob Comp Less 14 Wks  05/05/2014   CLINICAL DATA:  Vaginal bleeding beginning 05/02/2014. Early pregnancy. Patient unsure of LMP.  EXAM: OBSTETRIC <14 WK US AND TRANSVAGINAL OB US  TECHNIQUE: Both transabdominal and transvaginal ultrasound examinations were performed for complete evaluation of the gestation as well as the maternal uterus, adnexal regions, and pelvic cul-de-sac. Transvaginal technique was performed to assess early pregnancy.  COMPARISON:  None.  FINDINGS: Intrauterine gestational sac: Present. Small in size in comparison with embryo size.  Yolk sac:  Present.  Embryo:  Present.  Cardiac Activity: Present.  Heart Rate: Heart rate appeared too slow to accurately measure with M-mode imaging.  CRL:  3.8  mm   6 w   1 d                  US EDC: 12/28/2014  Maternal uterus/adnexae: Small amount of subchorionic hemorrhage in small volume pelvic free fluid or noted. Ovaries were normal in appearance, with a right ovarian corpus luteum noted.  IMPRESSION: Living intrauterine gestation as above. Gestational sac appears small and cardiac activity, while present, appears abnormally slow. Close clinical follow-up recommended with consideration for short-term repeat ultrasound.   Electronically Signed   By: Sebastian AcheAllen  Grady   On: 05/05/2014 15:48   [place] Ob Transvaginal  05/05/2014   CLINICAL DATA:  Vaginal bleeding beginning 05/02/2014. Early pregnancy. Patient unsure of LMP.   EXAM: OBSTETRIC <14 WK US AND TRANSVAGINAL OB US  TECHNIQUE: Both transabdominal and transvaginal ultrasound examinations were performed for complete evaluation of the gestation as well as the maternal uterus, adnexal regions, and pelvic cul-de-sac. Transvaginal technique was performed to assess early pregnancy.  COMPARISON:  None.  FINDINGS: Intrauterine gestational sac: Present. Small in size in comparison with embryo size.  Yolk sac:  Present.  Embryo:  Present.  Cardiac Activity: Present.  Heart Rate: Heart rate appeared too slow to accurately measure with M-mode imaging.  CRL:  3.8  mm   6 w   1 d                  US EDC: 12/28/2014  Maternal uterus/adnexae: Small amount of subchorionic hemorrhage in small volume pelvic free fluid or noted. Ovaries were normal in appearance, with a right ovarian corpus luteum noted.  IMPRESSION: Living intrauterine gestation as above. Gestational sac appears small and cardiac activity, while present, appears abnormally slow. Close clinical follow-up recommended with consideration for short-term repeat ultrasound.   Electronically Signed   By: Sebastian AcheAllen  Grady   On: 05/05/2014 15:48    MDM  Urine sent for culture. Spoke with Dr.Callahan.plan patient is keep her scheduled appointment at office next week. She is to go to maternity admissions unit if she worsens or continues to vomit.I suspect the patient may have partially treated urinary tract infection ureteral colic also possibility she feels well at discharge with only minimal discomfort and no need for further diagnostic evaluation Diagnosis #1 right flank pain #2 nausea and vomiting #3 microscopic hematuria   Doug SouSam Miyanna Wiersma, MD 05/11/14 1715

## 2014-05-11 NOTE — Discharge Instructions (Signed)
Keep your scheduled appointment with your gynecologist asked week. If your symptoms worsen or if you continue to have nausea or vomiting or feel worse for any reason go [facility]. Your urine has been sent for culture.  Youwill be called if the antibiotic needs to be changed.continue to take Macrobid as prescribed

## 2014-05-11 NOTE — ED Notes (Signed)
Pt reports 5 days ago had miscarriage, had rhogam shot on Monday.  States subsequently tx for uti.  Today with subjective fever, body aches, lower pelvic pain and n/v, no diarrhea.

## 2014-05-12 LAB — URINE CULTURE
COLONY COUNT: NO GROWTH
Culture: NO GROWTH
SPECIAL REQUESTS: NORMAL

## 2014-08-05 ENCOUNTER — Ambulatory Visit (INDEPENDENT_AMBULATORY_CARE_PROVIDER_SITE_OTHER): Payer: 59 | Admitting: Urgent Care

## 2014-08-05 VITALS — BP 108/58 | HR 83 | Temp 98.1°F | Resp 16 | Ht 64.0 in | Wt 114.2 lb

## 2014-08-05 DIAGNOSIS — N390 Urinary tract infection, site not specified: Secondary | ICD-10-CM | POA: Diagnosis not present

## 2014-08-05 DIAGNOSIS — R300 Dysuria: Secondary | ICD-10-CM

## 2014-08-05 DIAGNOSIS — R3915 Urgency of urination: Secondary | ICD-10-CM

## 2014-08-05 DIAGNOSIS — R350 Frequency of micturition: Secondary | ICD-10-CM

## 2014-08-05 DIAGNOSIS — N3090 Cystitis, unspecified without hematuria: Secondary | ICD-10-CM | POA: Insufficient documentation

## 2014-08-05 LAB — POCT UA - MICROSCOPIC ONLY
Casts, Ur, LPF, POC: NEGATIVE
Crystals, Ur, HPF, POC: NEGATIVE
Yeast, UA: NEGATIVE

## 2014-08-05 LAB — POCT URINALYSIS DIPSTICK
BILIRUBIN UA: NEGATIVE
GLUCOSE UA: NEGATIVE
Ketones, UA: NEGATIVE
Nitrite, UA: NEGATIVE
Protein, UA: 30
Spec Grav, UA: 1.025
UROBILINOGEN UA: 0.2
pH, UA: 6.5

## 2014-08-05 MED ORDER — CIPROFLOXACIN HCL 500 MG PO TABS: 500.0000 mg | ORAL_TABLET | Freq: Two times a day (BID) | ORAL | Status: DC

## 2014-08-05 MED ORDER — PHENAZOPYRIDINE HCL 200 MG PO TABS: 200.0000 mg | ORAL_TABLET | Freq: Three times a day (TID) | ORAL | Status: AC | PRN

## 2014-08-05 NOTE — Progress Notes (Signed)
MRN: [id number] DOB: 1985/03/11  Subjective:   Hannah Shaffer is a 29 y.o. female presenting for chief complaint of Urinary Frequency; Dysuria; and Back Pain  Reports 3 day history of dysuria, urinary frequency, urinary urgency, pelvic pain, cloudy malordorous urine and right sided low back pain. Has tried cranberry juice and is drinking plenty of water. Has a history of frequent UTIs for the past 4 years has been getting 1-2 per year following childbirth. Denies hematuria, flank pain and abdominal pain, fever, nausea, vomiting and vaginal irritation, genital rashes. Denies any other aggravating or relieving factors, no other questions or concerns.  Hannah Shaffer has a current medication list which includes the following prescription(s): prenatal multivitamin. She has No Known Allergies.  Hannah Shaffer  has a past medical history of No pertinent past medical history and Gonorrhea complicating pregnancy in second trimester. Also  has past surgical history that includes No past surgeries.  ROS As in subjective.  Objective:   Vitals: BP 108/58 mmHg  Pulse 83  Temp(Src) 98.1 F (36.7 C) (Oral)  Resp 16  Ht 5\' 4"  (1.626 m)  Wt 114 lb 3.2 oz (51.801 kg)  BMI 19.59 kg/m2  SpO2 99%  LMP 07/16/2014  Breastfeeding? No  Physical Exam  Constitutional: She is oriented to person, place, and time. She appears well-developed and well-nourished.  Cardiovascular: Normal rate, regular rhythm and intact distal pulses.  Exam reveals no gallop and no friction rub.   No murmur heard. Pulmonary/Chest: No respiratory distress. She has no wheezes. She has no rales.  Abdominal: Soft. Bowel sounds are normal. She exhibits no distension and no mass. There is no tenderness.  No CVA tenderness.  Musculoskeletal: She exhibits no edema.  Neurological: She is alert and oriented to person, place, and time.  Skin: Skin is warm and dry. No rash noted. No erythema. No pallor.   Results for orders placed or performed in  visit on 08/05/14 (from the past 24 hour(s))  POCT UA - Microscopic Only     Status: None   Collection Time: 08/05/14  8:32 AM  Result Value Ref Range   WBC, Ur, HPF, POC 4-8    RBC, urine, microscopic 0-2    Bacteria, U Microscopic few    Mucus, UA small    Epithelial cells, urine per micros 5-10    Crystals, Ur, HPF, POC neg    Casts, Ur, LPF, POC neg    Yeast, UA neg   POCT urinalysis dipstick     Status: Abnormal   Collection Time: 08/05/14  8:32 AM  Result Value Ref Range   Color, UA yellow    Clarity, UA clear    Glucose, UA neg    Bilirubin, UA neg    Ketones, UA neg    Spec Grav, UA 1.025    Blood, UA tr-lysed    pH, UA 6.5    Protein, UA 30    Urobilinogen, UA 0.2    Nitrite, UA neg    Leukocytes, UA moderate (2+) (A) Negative   Assessment and Plan :   1. Urinary frequency 2. Dysuria 3. Urgency of urination 4. Cystitis 5. Frequent UTI - UTI versus interstitial cystitis. Due to nature of frequent UTIs for the past 4 years following her pregnancy, will refer to urology for further evaluation. In the meantime, we'll start ciprofloxacin for 10 days, advised aggressive hydration and pyridium for dysuria. RTC if symptoms worsen despite antibiotic course.  Wallis BambergMario Lianette Broussard, PA-C [facility] [phone] 08/05/2014 8:30 AM

## 2014-08-05 NOTE — Patient Instructions (Signed)

## 2014-08-07 LAB — URINE CULTURE: Colony Count: >=100000

## 2014-08-17 ENCOUNTER — Emergency Department (HOSPITAL_COMMUNITY)
Admission: EM | Admit: 2014-08-17 | Discharge: 2014-08-17 | Disposition: A | Discharge status: Home / Self Care | Payer: 59 | Attending: Emergency Medicine | Admitting: Emergency Medicine

## 2014-08-17 ENCOUNTER — Emergency Department (HOSPITAL_COMMUNITY): Payer: 59

## 2014-08-17 ENCOUNTER — Encounter (HOSPITAL_COMMUNITY): Payer: Self-pay | Admitting: *Deleted

## 2014-08-17 DIAGNOSIS — M545 Low back pain: Secondary | ICD-10-CM | POA: Diagnosis present

## 2014-08-17 DIAGNOSIS — Z3202 Encounter for pregnancy test, result negative: Secondary | ICD-10-CM | POA: Insufficient documentation

## 2014-08-17 DIAGNOSIS — N1330 Unspecified hydronephrosis: Secondary | ICD-10-CM

## 2014-08-17 DIAGNOSIS — N1339 Other hydronephrosis: Secondary | ICD-10-CM | POA: Diagnosis not present

## 2014-08-17 DIAGNOSIS — N200 Calculus of kidney: Secondary | ICD-10-CM | POA: Insufficient documentation

## 2014-08-17 DIAGNOSIS — M549 Dorsalgia, unspecified: Secondary | ICD-10-CM

## 2014-08-17 LAB — CBC WITH DIFFERENTIAL/PLATELET
Basophils Absolute: 0 10*3/uL (ref 0.0–0.1)
Basophils Relative: 0 % (ref 0–1)
EOS PCT: 0 % (ref 0–5)
Eosinophils Absolute: 0 10*3/uL (ref 0.0–0.7)
HCT: 37.8 % (ref 36.0–46.0)
Hemoglobin: 12.6 g/dL (ref 12.0–15.0)
LYMPHS ABS: 0.8 10*3/uL (ref 0.7–4.0)
LYMPHS PCT: 12 % (ref 12–46)
MCH: 27.3 pg (ref 26.0–34.0)
MCHC: 33.3 g/dL (ref 30.0–36.0)
MCV: 82 fL (ref 78.0–100.0)
Monocytes Absolute: 0.3 10*3/uL (ref 0.1–1.0)
Monocytes Relative: 5 % (ref 3–12)
NEUTROS ABS: 5.2 10*3/uL (ref 1.7–7.7)
Neutrophils Relative %: 83 % — ABNORMAL HIGH (ref 43–77)
PLATELETS: 256 10*3/uL (ref 150–400)
RBC: 4.61 MIL/uL (ref 3.87–5.11)
RDW: 13.5 % (ref 11.5–15.5)
WBC: 6.3 10*3/uL (ref 4.0–10.5)

## 2014-08-17 LAB — URINE MICROSCOPIC-ADD ON

## 2014-08-17 LAB — URINALYSIS, ROUTINE W REFLEX MICROSCOPIC
Glucose, UA: NEGATIVE mg/dL
KETONES UR: 15 mg/dL — AB
NITRITE: POSITIVE — AB
PH: 5.5 (ref 5.0–8.0)
PROTEIN: NEGATIVE mg/dL
Specific Gravity, Urine: 1.027 (ref 1.005–1.030)
UROBILINOGEN UA: 1 mg/dL (ref 0.0–1.0)

## 2014-08-17 LAB — COMPREHENSIVE METABOLIC PANEL
ALT: 17 U/L (ref 14–54)
AST: 24 U/L (ref 15–41)
Albumin: 3.8 g/dL (ref 3.5–5.0)
Alkaline Phosphatase: 54 U/L (ref 38–126)
Anion gap: 11 (ref 5–15)
BILIRUBIN TOTAL: 0.5 mg/dL (ref 0.3–1.2)
BUN: 13 mg/dL (ref 6–20)
CHLORIDE: 103 mmol/L (ref 101–111)
CO2: 23 mmol/L (ref 22–32)
CREATININE: 0.73 mg/dL (ref 0.44–1.00)
Calcium: 9.3 mg/dL (ref 8.9–10.3)
GFR calc Af Amer: >60 mL/min (ref >60)
GFR calc non Af Amer: >60 mL/min (ref >60)
Glucose, Bld: 122 mg/dL — ABNORMAL HIGH (ref 65–99)
Potassium: 4.1 mmol/L (ref 3.5–5.1)
SODIUM: 137 mmol/L (ref 135–145)
Total Protein: 7.4 g/dL (ref 6.5–8.1)

## 2014-08-17 LAB — LIPASE, BLOOD: Lipase: 24 U/L (ref 22–51)

## 2014-08-17 LAB — WET PREP, GENITAL
Clue Cells Wet Prep HPF POC: NONE SEEN
TRICH WET PREP: NONE SEEN
WBC, Wet Prep HPF POC: NONE SEEN
Yeast Wet Prep HPF POC: NONE SEEN

## 2014-08-17 LAB — POC URINE PREG, ED: PREG TEST UR: NEGATIVE

## 2014-08-17 MED ORDER — MORPHINE SULFATE 4 MG/ML IJ SOLN
4.0000 mg | Freq: Once | INTRAMUSCULAR | Status: AC
  Administered 2014-08-17: 4 mg via INTRAVENOUS
  Filled 2014-08-17: qty 1

## 2014-08-17 MED ORDER — ONDANSETRON 4 MG PO TBDP
ORAL_TABLET | ORAL | Status: AC
  Filled 2014-08-17: qty 2

## 2014-08-17 MED ORDER — ONDANSETRON 4 MG PO TBDP
4.0000 mg | ORAL_TABLET | Freq: Once | ORAL | Status: DC
  Filled 2014-08-17: qty 1

## 2014-08-17 MED ORDER — PROMETHAZINE HCL 25 MG PO TABS
25.0000 mg | ORAL_TABLET | Freq: Once | ORAL | Status: DC
  Filled 2014-08-17: qty 1

## 2014-08-17 MED ORDER — METOCLOPRAMIDE HCL 5 MG/ML IJ SOLN
10.0000 mg | Freq: Once | INTRAMUSCULAR | Status: DC
  Filled 2014-08-17: qty 2

## 2014-08-17 MED ORDER — SODIUM CHLORIDE 0.9 % IV BOLUS (SEPSIS)
1000.0000 mL | Freq: Once | INTRAVENOUS | Status: AC
  Administered 2014-08-17: 1000 mL via INTRAVENOUS

## 2014-08-17 MED ORDER — ONDANSETRON 4 MG PO TBDP: 4.0000 mg | ORAL_TABLET | Freq: Three times a day (TID) | ORAL | Status: AC | PRN

## 2014-08-17 MED ORDER — ONDANSETRON HCL 4 MG/2ML IJ SOLN
4.0000 mg | Freq: Once | INTRAMUSCULAR | Status: AC
  Administered 2014-08-17: 4 mg via INTRAVENOUS
  Filled 2014-08-17: qty 2

## 2014-08-17 MED ORDER — IOHEXOL 300 MG/ML  SOLN
100.0000 mL | Freq: Once | INTRAMUSCULAR | Status: AC | PRN
  Administered 2014-08-17: 100 mL via INTRAVENOUS

## 2014-08-17 MED ORDER — ONDANSETRON 4 MG PO TBDP
8.0000 mg | ORAL_TABLET | Freq: Once | ORAL | Status: AC
  Administered 2014-08-17: 8 mg via ORAL

## 2014-08-17 MED ORDER — PROMETHAZINE HCL 25 MG/ML IJ SOLN
25.0000 mg | Freq: Once | INTRAMUSCULAR | Status: AC
  Administered 2014-08-17: 25 mg via INTRAVENOUS
  Filled 2014-08-17: qty 1

## 2014-08-17 MED ORDER — IOHEXOL 300 MG/ML  SOLN
50.0000 mL | Freq: Once | INTRAMUSCULAR | Status: AC | PRN
  Administered 2014-08-17: 50 mL via ORAL

## 2014-08-17 MED ORDER — OXYCODONE-ACETAMINOPHEN 5-325 MG PO TABS: 1.0000 | ORAL_TABLET | Freq: Four times a day (QID) | ORAL | Status: DC | PRN

## 2014-08-17 NOTE — ED Notes (Signed)
Pt sts she is still experiencing some nausea but she is prepared to go home with ODT Zofran to rest for her procedure tomorrow.  Pt understands the need to continue pushing fluids until her cut off for her procedure, and understands circumstances when she should return to the ED (intractable vomiting, etc.)

## 2014-08-17 NOTE — ED Provider Notes (Signed)
CSN: [id number]     Arrival date & time 08/17/14  1427 History   First MD Initiated Contact with Patient 08/17/14 1628     Chief Complaint  Patient presents with  . Emesis  . Back Pain     (Consider location/radiation/quality/duration/timing/severity/associated sxs/prior Treatment) HPI    PCP: No primary care provider on file. Blood pressure 132/95, pulse 106, temperature 98.4 F (36.9 C), temperature source Oral, resp. rate 16, height  (1.626 m), weight 115 lb (52.164 kg), last menstrual period 07/19/2014, SpO2 98 %, not currently breastfeeding.  Hannah Shaffer is a 29 y.o.female with a significant PMH of recurrent UTIs presents to the ER with complaints of right flank and low back pain, small amount of dysuria and pressure. Patient reports that over the past 3 months she has been having recurrent urinary tract infection, she has been treated within antibiotics and pyridium.  She has a referral appointment to urology which will not be for a few more weeks. Today she is having nausea and vomiting therefore presents to the emergency department for further evaluation. She denies hematuria. On arrival the patient is tachycardic at 117. She took her last dose of Pyridium at 1 PM this afternoon.   The patient denies diaphoresis, fever, headache, weakness (general or focal), confusion, change of vision,  neck pain, dysphagia, aphagia, chest pain, shortness of breath,  back pain,  diarrhea, lower extremity swelling, rash.   Past Medical History  Diagnosis Date  . No pertinent past medical history   . Gonorrhea complicating pregnancy in second trimester    Past Surgical History  Procedure Laterality Date  . No past surgeries     Family History  Problem Relation Age of Onset  . Other Neg Hx   . Heart disease Father 71  . Heart disease Paternal Grandfather    History  Substance Use Topics  . Smoking status: Never Smoker   . Smokeless tobacco: Never Used  . Alcohol Use: No    OB History    Gravida Para Term Preterm AB TAB SAB Ectopic Multiple Living   0 0 0 0 0 0 3     Review of Systems  10 Systems reviewed and are negative for acute change except as noted in the HPI.    Allergies  Review of patient's allergies indicates no known allergies.  Home Medications   Prior to Admission medications   Medication Sig Start Date End Date Taking? Authorizing Provider  acetaminophen (TYLENOL) 325 MG tablet Take 650 mg by mouth every 6 (six) hours as needed (pain).   Yes Historical Provider, MD  phenazopyridine (PYRIDIUM) 200 MG tablet Take 1 tablet (200 mg total) by mouth 3 (three) times daily as needed for pain. 08/05/14  Yes Wallis Bamberg, PA-C   BP 132/95 mmHg  Pulse 106  Temp(Src) 98.4 F (36.9 C) (Oral)  Resp 16  Ht  (1.626 m)  Wt 115 lb (52.164 kg)  BMI 19.73 kg/m2  SpO2 98%  LMP 07/19/2014 Physical Exam  Constitutional: She appears well-developed and well-nourished. No distress.  HENT:  Head: Normocephalic and atraumatic.  Eyes: Pupils are equal, round, and reactive to light.  Neck: Normal range of motion. Neck supple.  Cardiovascular: Normal rate and regular rhythm.   Pulmonary/Chest: Effort normal.  Abdominal: Soft. Bowel sounds are normal. There is tenderness. There is CVA tenderness (right flank pain). There is no rigidity, no rebound and no guarding.  Active vomiting  Neurological: She is alert.  Skin: Skin is warm and dry.  Nursing note and vitals reviewed.   ED Course  Procedures (including critical care time) Labs Review Labs Reviewed  CBC WITH DIFFERENTIAL/PLATELET - Abnormal; Notable for the following:    Neutrophils Relative % 83 (*)    All other components within normal limits  COMPREHENSIVE METABOLIC PANEL - Abnormal; Notable for the following:    Glucose, Bld 122 (*)    All other components within normal limits  URINALYSIS, ROUTINE W REFLEX MICROSCOPIC (NOT AT [facility]) - Abnormal; Notable for the following:    Color,  Urine ORANGE (*)    Hgb urine dipstick TRACE (*)    Bilirubin Urine SMALL (*)    Ketones, ur 15 (*)    Nitrite POSITIVE (*)    Leukocytes, UA SMALL (*)    All other components within normal limits  WET PREP, GENITAL  LIPASE, BLOOD  URINE MICROSCOPIC-ADD ON  POC URINE PREG, ED  GC/CHLAMYDIA PROBE AMP ([place]) NOT AT [facility]    Imaging Review Ct Abdomen Pelvis W Contrast  08/17/2014   CLINICAL DATA:  Severe right flank pain and back pain with nausea and vomiting. Symptoms began today.  EXAM: CT ABDOMEN AND PELVIS WITH CONTRAST  TECHNIQUE: Multidetector CT imaging of the abdomen and pelvis was performed using the standard protocol following bolus administration of intravenous contrast.  CONTRAST:  OMNIPAQUE IOHEXOL 300 MG/ML  SOLN  COMPARISON:  None.  FINDINGS: Lung bases are clear. No pleural or pericardial fluid. The liver has a normal appearance without focal lesions or biliary ductal dilatation. No calcified gallstones. The spleen is normal. The pancreas is normal. The adrenal glands are normal. The left kidney is normal. No cyst, mass, stone or hydronephrosis. There is hydroureteronephrosis of the right kidney with the ureter dilated all the way to the bladder. There is a 7 mm stone in the distal right ureter responsible for the obstruction. No stone in the bladder. Uterus and adnexal regions are normal. No bowel pathology is seen.  IMPRESSION: Hydroureteronephrosis on the right due to a 7 mm stone at the right UVJ.   Electronically Signed   By: Paulina Fusi M.D.   On: 08/17/2014 20:15     EKG Interpretation None      MDM   Final diagnoses:  Back pain  Kidney stone  Hydroureteronephrosis    Patient has Nitrite positive urine, concern for UTI CT abd/pelv shows 7  mm stone, discussed with Urology- he is going to come see patient and discuss stent vs abx and home. Concern for Pyridium causing positive nitrite and patient not having a true UTI. Patient aware of plan, is  currently comfortable and is agreeable.   The Urologist saw the patient and he recommends no antibiotics, NPO after midnight, she has [facility] number and will be given it at discharge. The patient will have a stent placed tomorrow. Will give rx for pain medication, pt advised to wait until she talks to Urology office in the morning before taking any medications after midnight.  Medications  ondansetron (ZOFRAN-ODT) disintegrating tablet 8 mg (8 mg Oral Given 08/17/14 1451)  sodium chloride 0.9 % bolus 1,000 mL (0 mLs Intravenous Stopped 08/17/14 1811)  ondansetron (ZOFRAN) injection 4 mg (4 mg Intravenous Given 08/17/14 1701)  morphine 4 MG/ML injection 4 mg (4 mg Intravenous Given 08/17/14 1701)  iohexol (OMNIPAQUE) 300 MG/ML solution 50 mL (50 mLs Oral Contrast Given 08/17/14 1737)  morphine 4 MG/ML injection 4 mg (4 mg Intravenous  Given 08/17/14 1916)  ondansetron (ZOFRAN) injection 4 mg (4 mg Intravenous Given 08/17/14 1916)  sodium chloride 0.9 % bolus 1,000 mL (1,000 mLs Intravenous New Bag/Given 08/17/14 2012)  iohexol (OMNIPAQUE) 300 MG/ML solution 100 mL (100 mLs Intravenous Contrast Given 08/17/14 1947)  promethazine (PHENERGAN) injection 25 mg (25 mg Intravenous Given 08/17/14 2039)    28 y.o.Ernest HaberLindsay B Broaddus's evaluation in the Emergency Department is complete. It has been determined that no acute conditions requiring further emergency intervention are present at this time. The patient/guardian have been advised of the diagnosis and plan. We have discussed signs and symptoms that warrant return to the ED, such as changes or worsening in symptoms.  Vital signs are stable at discharge. Filed Vitals:   08/17/14 1918  BP: 132/95  Pulse: 106  Temp:   Resp: 16    Patient/guardian has voiced understanding and agreed to follow-up with the PCP or specialist.   Marlon Peliffany Cheryn Lundquist, PA-C 08/17/14 2156  Lorre NickAnthony Allen, MD 08/17/14 2320

## 2014-08-17 NOTE — ED Notes (Signed)
Pt's IV not patent at this time.  PA made aware, will follow up with oral med.

## 2014-08-17 NOTE — ED Notes (Signed)
Pt resting quietly at the time. Some pain relief with medications. Vital signs stable. Pelvic exam performed without difficulty, cultures sent to lab for testing. Pt drinking oral contrast for CT at the time.

## 2014-08-17 NOTE — ED Notes (Signed)
Pt actively wretching and vomiting in room at this time.

## 2014-08-17 NOTE — ED Notes (Signed)
Pt given ODT Zofran while PA changing orders, unaware of change until pt received medication.  PA made aware.

## 2014-08-17 NOTE — Discharge Instructions (Signed)

## 2014-08-17 NOTE — ED Notes (Signed)
Ct notified pt unable to finish second bottle of contrast due to nausea.

## 2014-08-17 NOTE — ED Notes (Addendum)
Pt reports having right side back pain and n/v. Recently having multiple UTI. N/v at triage.

## 2014-08-17 NOTE — ED Notes (Signed)
Signature pad not functioning in the room.  Pt and spouse and parent verbalized undertsanding of discharge order and reasons to return to the ED.

## 2014-08-17 NOTE — Consult Note (Signed)
Urology Consult Note   Requesting Attending Physician:  Lorre Nick, MD Service Requesting Consult:  Emergency Department  Assessment:  29yF previously healthy with 28mm x59mm stone in the RIGHT distal ureter with significant hydroureteronephrosis. No signs of urinary tract infection (nitrite positivity false positive in setting of pyridium use earlier today). No indications for urgent or emergent intervention. Will plan to have her see [place] in the office tomorrow morning and will plan outpatient ureteroscopic stone extraction for tomorrow as well  Recommendations:  -Ok for discharge home with instructions to return if febrile. -Instructed to be NPO after MN for planned outpatient surgery tomorrow morning -No indication for antibiotics at this time -Discharge on medical expulsive therapy (flomax, percocet and urine strainer) -Plan to see in clinic tomorrow morning and proceed with USE  Discussed with Dr. Ronne Binning  HPI:  Reason for Consult: 29 y.o. female  nephrolithiasis  29 y.o. female is seen in consultation for nephrolithiasis at the request of Lorre Nick, MD  29yF previously helathy with 2-3 months of mild dysuria and low back pain. Pain worsened over the course of the day today and was accompanied by nausea and emesis prompting ED visit. Denies subjective fevers, chills. Tachycardic on admission to 117 prior to pain medication administration now hemodynamically stable. CT imaging demonstrated a significant stone in the distal RIGHT ureter with associated hydroureteronephrosis without perinephric or periureteral standing. Urinalysis not impressive for infection with the excepetion of nitrite positivity, however she had taken pyridium several hours prior which produces a false positive nitrite test. Small LE, rare bacteria and 5 WBC/hpf and minimal RBC/hpf.  Denies f/c, gross hematuria, prior history of stones or any significant past urologic history.  She had been referred to [facility] for  "recurrent UTIs" However, she has had no culture proven infections and all symptoms could be explained by a distal ureteral stone. She has never been imaged before this evening.  Allergies Review of patient's allergies indicates no known allergies.  Medications   No current facility-administered medications for this encounter.   Current Outpatient Prescriptions  Medication Sig Dispense Refill  . acetaminophen (TYLENOL) 325 MG tablet Take 650 mg by mouth every 6 (six) hours as needed (pain).    . phenazopyridine (PYRIDIUM) 200 MG tablet Take 1 tablet (200 mg total) by mouth 3 (three) times daily as needed for pain. 15 tablet 0  . ondansetron (ZOFRAN ODT) 4 MG disintegrating tablet Take 1 tablet (4 mg total) by mouth every 8 (eight) hours as needed for nausea or vomiting. 20 tablet 0  . oxyCODONE-acetaminophen (PERCOCET/ROXICET) 5-325 MG per tablet Take 1-2 tablets by mouth every 6 (six) hours as needed. 20 tablet 0    (Not in a hospital admission)  Past Medical History Past Medical History  Diagnosis Date  . No pertinent past medical history   . Gonorrhea complicating pregnancy in second trimester     Past Surgical History Past Surgical History  Procedure Laterality Date  . No past surgeries      Family History family history includes Heart disease in her paternal grandfather; Heart disease (age of onset: 41) in her father. There is no history of Other.  Social History: Tobacco use:   reports that she has never smoked. She has never used smokeless tobacco. Alcohol use:   reports that she does not drink alcohol. Drug use:  reports that she does not use illicit drugs. Patient  reports that she has never smoked. She has never used smokeless tobacco. She reports that she does not  drink alcohol or use illicit drugs.   Review of Systems 10 system review of systems reviewed and negative except as noted in the HPI  Objective  Vital Signs Temp:  [98.4 F (36.9 C)] 98.4 F (36.9  C) (07/10 1448) Pulse Rate:  [82-117] 87 (07/10 2200) Resp:  [16] 16 (07/10 1918) BP: (103-137)/(64-110) 114/72 mmHg (07/10 2200) SpO2:  [97 %-100 %] 97 % (07/10 2200) Weight:  [52.164 kg (115 lb)] 52.164 kg (115 lb) (07/10 1448)    Physical Exam BP 114/72 mmHg  Pulse 87  Temp(Src) 98.4 F (36.9 C) (Oral)  Resp 16  Ht 5\' 4"  (1.626 m)  Wt 52.164 kg (115 lb)  BMI 19.73 kg/m2  SpO2 97%  LMP 07/19/2014  General: well developed, well nourished, no acute distress HEENT: PERLA, EOM intact, normocephalic, atraumatic Chest: symmetrical Lungs: normal work of breathing on room air Heart: regular rate Abdomen: no tenderness, no masses or hernias, no palpable organomegaly GU: no CVAT Rectal: deferred Extremities: no deformities, no edema, no cyanosis   Test Results Urinalysis: N+, small LE, rare bacteria, 0-2 rbc/hpf and 3-6 WBC/hpf  Laboratory Studies Lab Results  Component Value Date   WBC 6.3 08/17/2014   HGB 12.6 08/17/2014   HCT 37.8 08/17/2014   PLT 256 08/17/2014    Lab Results  Component Value Date   NA 137 08/17/2014   K 4.1 08/17/2014   CL 103 08/17/2014   CO2 23 08/17/2014   BUN 13 08/17/2014   CREATININE 0.73 08/17/2014   CALCIUM 9.3 08/17/2014    Lab Results  Component Value Date   BILITOT 0.5 08/17/2014   PROT 7.4 08/17/2014   ALBUMIN 3.8 08/17/2014   ALT 17 08/17/2014   AST 24 08/17/2014   ALKPHOS 54 08/17/2014    No results found for: LABPROT, INR, APTT    Imaging Studies Imaging:  EXAM: CT ABDOMEN AND PELVIS WITH CONTRAST  TECHNIQUE: Multidetector CT imaging of the abdomen and pelvis was performed using the standard protocol following bolus administration of intravenous contrast.  CONTRAST: 100mL OMNIPAQUE IOHEXOL 300 MG/ML SOLN  COMPARISON: None.  FINDINGS: Lung bases are clear. No pleural or pericardial fluid. The liver has a normal appearance without focal lesions or biliary ductal dilatation. No calcified gallstones.  The spleen is normal. The pancreas is normal. The adrenal glands are normal. The left kidney is normal. No cyst, mass, stone or hydronephrosis. There is hydroureteronephrosis of the right kidney with the ureter dilated all the way to the bladder. There is a 7 mm stone in the distal right ureter responsible for the obstruction. No stone in the bladder. Uterus and adnexal regions are normal. No bowel pathology is seen.  IMPRESSION: Hydroureteronephrosis on the right due to a 7 mm stone at the right UVJ.

## 2014-08-17 NOTE — ED Notes (Addendum)
Went in to complete patient's DC.  Pt c/o intense nausea.  Wretching in the room with RN.  PA made aware.

## 2014-08-18 ENCOUNTER — Ambulatory Visit (HOSPITAL_BASED_OUTPATIENT_CLINIC_OR_DEPARTMENT_OTHER): Payer: 59 | Admitting: Anesthesiology

## 2014-08-18 ENCOUNTER — Other Ambulatory Visit: Payer: Self-pay | Admitting: Urology

## 2014-08-18 ENCOUNTER — Ambulatory Visit (HOSPITAL_BASED_OUTPATIENT_CLINIC_OR_DEPARTMENT_OTHER)
Admission: RE | Admit: 2014-08-18 | Discharge: 2014-08-18 | Disposition: A | Discharge status: Home / Self Care | Payer: 59 | Source: Ambulatory Visit | Attending: Urology | Admitting: Urology

## 2014-08-18 ENCOUNTER — Encounter (HOSPITAL_BASED_OUTPATIENT_CLINIC_OR_DEPARTMENT_OTHER)
Admission: RE | Disposition: A | Discharge status: Home / Self Care | Payer: Self-pay | Source: Ambulatory Visit | Attending: Urology

## 2014-08-18 ENCOUNTER — Encounter (HOSPITAL_BASED_OUTPATIENT_CLINIC_OR_DEPARTMENT_OTHER): Payer: Self-pay

## 2014-08-18 DIAGNOSIS — N132 Hydronephrosis with renal and ureteral calculous obstruction: Secondary | ICD-10-CM | POA: Diagnosis not present

## 2014-08-18 DIAGNOSIS — R109 Unspecified abdominal pain: Secondary | ICD-10-CM | POA: Diagnosis present

## 2014-08-18 HISTORY — PX: URETEROSCOPY WITH HOLMIUM LASER LITHOTRIPSY: SHX6645

## 2014-08-18 HISTORY — PX: CYSTOSCOPY WITH URETEROSCOPY AND STENT PLACEMENT: SHX6377

## 2014-08-18 LAB — GC/CHLAMYDIA PROBE AMP (~~LOC~~) NOT AT ARMC
Chlamydia: NEGATIVE
Neisseria Gonorrhea: NEGATIVE

## 2014-08-18 SURGERY — CYSTOURETEROSCOPY, WITH STENT INSERTION
Anesthesia: General | Site: Bladder | Laterality: Right

## 2014-08-18 MED ORDER — DEXAMETHASONE SODIUM PHOSPHATE 10 MG/ML IJ SOLN
INTRAMUSCULAR | Status: DC | PRN
  Administered 2014-08-18: 10 mg via INTRAVENOUS

## 2014-08-18 MED ORDER — IOHEXOL 350 MG/ML SOLN
INTRAVENOUS | Status: DC | PRN
  Administered 2014-08-18: 3 mL

## 2014-08-18 MED ORDER — CEFAZOLIN SODIUM-DEXTROSE 2-3 GM-% IV SOLR
INTRAVENOUS | Status: AC
  Filled 2014-08-18: qty 50

## 2014-08-18 MED ORDER — ONDANSETRON HCL 4 MG/2ML IJ SOLN
INTRAMUSCULAR | Status: AC
  Filled 2014-08-18: qty 2

## 2014-08-18 MED ORDER — MIDAZOLAM HCL 2 MG/2ML IJ SOLN
INTRAMUSCULAR | Status: AC
  Filled 2014-08-18: qty 2

## 2014-08-18 MED ORDER — TAMSULOSIN HCL 0.4 MG PO CAPS: 0.4000 mg | ORAL_CAPSULE | Freq: Every day | ORAL | Status: AC

## 2014-08-18 MED ORDER — FENTANYL CITRATE (PF) 100 MCG/2ML IJ SOLN
25.0000 ug | INTRAMUSCULAR | Status: DC | PRN
  Filled 2014-08-18: qty 1

## 2014-08-18 MED ORDER — CEFAZOLIN SODIUM 1-5 GM-% IV SOLN
1.0000 g | INTRAVENOUS | Status: DC
  Filled 2014-08-18: qty 50

## 2014-08-18 MED ORDER — SODIUM CHLORIDE 0.9 % IR SOLN
Status: DC | PRN
  Administered 2014-08-18: 3000 mL via INTRAVESICAL
  Administered 2014-08-18: 1000 mL via INTRAVESICAL

## 2014-08-18 MED ORDER — LACTATED RINGERS IV SOLN
INTRAVENOUS | Status: DC
Start: 2014-08-18 — End: 2014-08-18
  Administered 2014-08-18 (×3): via INTRAVENOUS
  Filled 2014-08-18: qty 1000

## 2014-08-18 MED ORDER — PROMETHAZINE HCL 25 MG/ML IJ SOLN
INTRAMUSCULAR | Status: AC
  Filled 2014-08-18: qty 1

## 2014-08-18 MED ORDER — LIDOCAINE HCL (CARDIAC) 20 MG/ML IV SOLN
INTRAVENOUS | Status: DC | PRN
  Administered 2014-08-18: 60 mg via INTRAVENOUS

## 2014-08-18 MED ORDER — MIDAZOLAM HCL 5 MG/5ML IJ SOLN
INTRAMUSCULAR | Status: DC | PRN
  Administered 2014-08-18: 2 mg via INTRAVENOUS

## 2014-08-18 MED ORDER — KETOROLAC TROMETHAMINE 30 MG/ML IJ SOLN
INTRAMUSCULAR | Status: DC | PRN
  Administered 2014-08-18: 30 mg via INTRAVENOUS

## 2014-08-18 MED ORDER — PROPOFOL 10 MG/ML IV BOLUS
INTRAVENOUS | Status: DC | PRN
  Administered 2014-08-18: 200 mg via INTRAVENOUS

## 2014-08-18 MED ORDER — PROMETHAZINE HCL 25 MG/ML IJ SOLN
12.5000 mg | Freq: Once | INTRAMUSCULAR | Status: DC
  Filled 2014-08-18: qty 1

## 2014-08-18 MED ORDER — PROMETHAZINE HCL 25 MG/ML IJ SOLN
6.2500 mg | INTRAMUSCULAR | Status: DC | PRN
  Administered 2014-08-18: 6.25 mg via INTRAVENOUS
  Filled 2014-08-18: qty 1

## 2014-08-18 MED ORDER — FENTANYL CITRATE (PF) 100 MCG/2ML IJ SOLN
INTRAMUSCULAR | Status: AC
  Filled 2014-08-18: qty 4

## 2014-08-18 MED ORDER — ONDANSETRON HCL 4 MG/2ML IJ SOLN
4.0000 mg | Freq: Once | INTRAMUSCULAR | Status: AC
  Administered 2014-08-18: 4 mg via INTRAVENOUS
  Filled 2014-08-18: qty 2

## 2014-08-18 MED ORDER — OXYCODONE-ACETAMINOPHEN 5-325 MG PO TABS: 1.0000 | ORAL_TABLET | Freq: Four times a day (QID) | ORAL | Status: AC | PRN

## 2014-08-18 MED ORDER — CEFAZOLIN SODIUM-DEXTROSE 2-3 GM-% IV SOLR
2.0000 g | INTRAVENOUS | Status: AC
  Administered 2014-08-18: 2 g via INTRAVENOUS
  Filled 2014-08-18: qty 50

## 2014-08-18 MED ORDER — FENTANYL CITRATE (PF) 100 MCG/2ML IJ SOLN
INTRAMUSCULAR | Status: DC | PRN
  Administered 2014-08-18 (×4): 25 ug via INTRAVENOUS

## 2014-08-18 MED ORDER — ONDANSETRON HCL 4 MG/2ML IJ SOLN
INTRAMUSCULAR | Status: DC | PRN
  Administered 2014-08-18: 4 mg via INTRAVENOUS

## 2014-08-18 MED ORDER — ACETAMINOPHEN 10 MG/ML IV SOLN
INTRAVENOUS | Status: DC | PRN
  Administered 2014-08-18: 1000 mg via INTRAVENOUS

## 2014-08-18 SURGICAL SUPPLY — 21 items
BAG URO CATCHER STRL LF (DRAPE) ×3 IMPLANT
BASKET STONE 1.7 NGAGE (UROLOGICAL SUPPLIES) ×3 IMPLANT
CATH INTERMIT  6FR 70CM (CATHETERS) ×3 IMPLANT
CLOTH BEACON ORANGE TIMEOUT ST (SAFETY) ×3 IMPLANT
FIBER LASER FLEXIVA 365 (UROLOGICAL SUPPLIES) IMPLANT
FIBER LASER TRAC TIP (UROLOGICAL SUPPLIES) ×3 IMPLANT
GLOVE BIO SURGEON STRL SZ8 (GLOVE) ×3 IMPLANT
GOWN STRL REUS W/ TWL LRG LVL3 (GOWN DISPOSABLE) ×1 IMPLANT
GOWN STRL REUS W/ TWL XL LVL3 (GOWN DISPOSABLE) ×1 IMPLANT
GOWN STRL REUS W/TWL LRG LVL3 (GOWN DISPOSABLE) ×2
GOWN STRL REUS W/TWL XL LVL3 (GOWN DISPOSABLE) ×2
GUIDEWIRE ANG ZIPWIRE 038X150 (WIRE) ×3 IMPLANT
GUIDEWIRE STR DUAL SENSOR (WIRE) ×3 IMPLANT
IV NS IRRIG 3000ML ARTHROMATIC (IV SOLUTION) ×3 IMPLANT
IV SOD CHL 0.9% 1000ML (IV SOLUTION) ×3 IMPLANT
MANIFOLD NEPTUNE II (INSTRUMENTS) ×3 IMPLANT
PACK CYSTO (CUSTOM PROCEDURE TRAY) ×3 IMPLANT
SHEATH ACCESS URETERAL 38CM (SHEATH) ×3 IMPLANT
STENT URET 6FRX26 CONTOUR (STENTS) ×3 IMPLANT
SYRINGE 10CC LL (SYRINGE) ×3 IMPLANT
TUBE FEEDING 8FR 16IN STR KANG (MISCELLANEOUS) IMPLANT

## 2014-08-18 NOTE — Transfer of Care (Signed)
Last Vitals:  Filed Vitals:   08/18/14 1215  BP: 99/53  Pulse: 85  Temp: 37 C  Resp: 18    Immediate Anesthesia Transfer of Care Note  Patient: Hannah Shaffer  Procedure(s) Performed: Procedure(s) (LRB): CYSTOSCOPY WITH URETEROSCOPY AND STENT PLACEMENT (Right) URETEROSCOPY WITH HOLMIUM LASER LITHOTRIPSY, and STONE BASKETRY (Right)  Patient Location: PACU  Anesthesia Type: General  Level of Consciousness: awake, alert  and oriented  Airway & Oxygen Therapy: Patient Spontanous Breathing and Patient connected to face mask oxygen  Post-op Assessment: Report given to PACU RN and Post -op Vital signs reviewed and stable  Post vital signs: Reviewed and stable  Complications: No apparent anesthesia complications

## 2014-08-18 NOTE — Discharge Instructions (Signed)

## 2014-08-18 NOTE — Brief Op Note (Signed)
08/18/2014  3:39 PM  PATIENT:  Matilde SprangLindsay B Montrose  29 y.o. female  PRE-OPERATIVE DIAGNOSIS:  right ureteral stone  POST-OPERATIVE DIAGNOSIS:  right ureteral stone  PROCEDURE:  Procedure(s) with comments: CYSTOSCOPY WITH URETEROSCOPY AND STENT PLACEMENT (Right) - right ureteroscope, cysto, stent placement and right bsket stone extraction  holmium laser c arm  URETEROSCOPY WITH HOLMIUM LASER LITHOTRIPSY, and STONE BASKETRY (Right)  SURGEON:  Surgeon(s) and Role:    * Malen GauzePatrick L Stephens Shreve, MD - Primary  PHYSICIAN ASSISTANT:   ASSISTANTS: none   ANESTHESIA:   general  EBL:  Total I/O In: 1000 [I.V.:1000] Out: -   BLOOD ADMINISTERED:none  DRAINS: 6x26 JJ ureteral stent without tether  LOCAL MEDICATIONS USED:  NONE  SPECIMEN:  No Specimen  DISPOSITION OF SPECIMEN:  N/A  COUNTS:  YES  TOURNIQUET:  * No tourniquets in log *  DICTATION: .Note written in EPIC  PLAN OF CARE: Discharge to home after PACU  PATIENT DISPOSITION:  PACU - hemodynamically stable.   Delay start of Pharmacological VTE agent (>24hrs) due to surgical blood loss or risk of bleeding: not applicable

## 2014-08-18 NOTE — Anesthesia Postprocedure Evaluation (Signed)
Anesthesia Post Note  Patient: Hannah SprangLindsay B Shaffer  Procedure(s) Performed: Procedure(s) (LRB): CYSTOSCOPY WITH URETEROSCOPY AND STENT PLACEMENT (Right) URETEROSCOPY WITH HOLMIUM LASER LITHOTRIPSY, and STONE BASKETRY (Right)  Anesthesia type: General  Patient location: PACU  Post pain: Pain level controlled and Adequate analgesia  Post assessment: Post-op Vital signs reviewed, Patient's Cardiovascular Status Stable, Respiratory Function Stable, Patent Airway and Pain level controlled  Last Vitals:  Filed Vitals:   08/18/14 1730  BP: 117/76  Pulse: 76  Temp: 36.9 C  Resp: 16    Post vital signs: Reviewed and stable  Level of consciousness: awake, alert  and oriented  Complications: No apparent anesthesia complications

## 2014-08-18 NOTE — H&P (Signed)
Urology Admission H&P  Chief Complaint: right flank pain  History of Present Illness: [name] is a 29yo seen today for R flank pain and severe LUTS. She was seen in [facility] last night and diagnosed with a 7mm R UVJ stone. She denies fevers/chills/sweats.   Past Medical History  Diagnosis Date  . No pertinent past medical history   . Gonorrhea complicating pregnancy in second trimester    Past Surgical History  Procedure Laterality Date  . No past surgeries      Home Medications:  Prescriptions prior to admission  Medication Sig Dispense Refill Last Dose  . acetaminophen (TYLENOL) 325 MG tablet Take 650 mg by mouth every 6 (six) hours as needed (pain).   08/17/2014 at 1130  . ondansetron (ZOFRAN ODT) 4 MG disintegrating tablet Take 1 tablet (4 mg total) by mouth every 8 (eight) hours as needed for nausea or vomiting. 20 tablet 0   . oxyCODONE-acetaminophen (PERCOCET/ROXICET) 5-325 MG per tablet Take 1-2 tablets by mouth every 6 (six) hours as needed. 20 tablet 0   . phenazopyridine (PYRIDIUM) 200 MG tablet Take 1 tablet (200 mg total) by mouth 3 (three) times daily as needed for pain. 15 tablet 0 08/17/2014 at 1300   Allergies: No Known Allergies  Family History  Problem Relation Age of Onset  . Other Neg Hx   . Heart disease Father [id number]  . Heart disease Paternal Grandfather    Social History:  reports that she has never smoked. She has never used smokeless tobacco. She reports that she does not drink alcohol or use illicit drugs.  Review of Systems  Gastrointestinal: Positive for nausea and vomiting.  Genitourinary: Positive for dysuria, urgency, frequency and flank pain.  All other systems reviewed and are negative.   Physical Exam:  Vital signs in last 24 hours: Temp:  [98 F (36.7 C)-98.6 F (37 C)] 98.6 F (37 C) (07/11 1215) Pulse Rate:  [82-117] 85 (07/11 1215) Resp:  [16-18] 18 (07/11 1215) BP: (99-137)/(53-110) 99/53 mmHg (07/11 1215) SpO2:  [97 %-100 %] 100 %  (07/11 1215) Weight:  [52.164 kg (115 lb)-52.39 kg (115 lb 8 oz)] 52.39 kg (115 lb 8 oz) (07/11 1215) Physical Exam  Constitutional: She is oriented to person, place, and time. She appears well-developed and well-nourished. No distress.  HENT:  Head: Normocephalic and atraumatic.  Eyes: Conjunctivae are normal. Pupils are equal, round, and reactive to light.  Neck: Normal range of motion. Neck supple. No tracheal deviation present. No thyromegaly present.  Cardiovascular: Normal rate and regular rhythm.   Respiratory: Effort normal and breath sounds normal.  GI: Soft. She exhibits no distension and no mass. There is no tenderness. There is no rebound and no guarding.  Musculoskeletal: Normal range of motion.  Neurological: She is alert and oriented to person, place, and time.  Skin: Skin is warm and dry. She is not diaphoretic.  Psychiatric: She has a normal mood and affect. Her behavior is normal. Judgment and thought content normal.    Laboratory Data:  Results for orders placed or performed during the hospital encounter of 08/17/14 (from the past 24 hour(s))  Urinalysis, Routine w reflex microscopic (not at [facility])     Status: Abnormal   Collection Time: 08/17/14  3:11 PM  Result Value Ref Range   Color, Urine ORANGE (A) YELLOW   APPearance CLEAR CLEAR   Specific Gravity, Urine 1.027 1.005 - 1.030   pH 5.5 5.0 - 8.0   Glucose, UA NEGATIVE  NEGATIVE mg/dL   Hgb urine dipstick TRACE (A) NEGATIVE   Bilirubin Urine SMALL (A) NEGATIVE   Ketones, ur 15 (A) NEGATIVE mg/dL   Protein, ur NEGATIVE NEGATIVE mg/dL   Urobilinogen, UA 1.0 0.0 - 1.0 mg/dL   Nitrite POSITIVE (A) NEGATIVE   Leukocytes, UA SMALL (A) NEGATIVE  Urine microscopic-add on     Status: None   Collection Time: 08/17/14  3:11 PM  Result Value Ref Range   WBC, UA 3-6 <3 WBC/hpf   RBC / HPF 0-2 <3 RBC/hpf   Bacteria, UA RARE RARE  CBC with Differential     Status: Abnormal   Collection Time: 08/17/14  3:15 PM  Result  Value Ref Range   WBC 6.3 4.0 - 10.5 K/uL   RBC 4.61 3.87 - 5.11 MIL/uL   Hemoglobin 12.6 12.0 - 15.0 g/dL   HCT 40.9 81.1 - 91.4 %   MCV 82.0 78.0 - 100.0 fL   MCH 27.3 26.0 - 34.0 pg   MCHC 33.3 30.0 - 36.0 g/dL   RDW 78.2 95.6 - 21.3 %   Platelets 256 150 - 400 K/uL   Neutrophils Relative % 83 (H) 43 - 77 %   Neutro Abs 5.2 1.7 - 7.7 K/uL   Lymphocytes Relative 12 12 - 46 %   Lymphs Abs 0.8 0.7 - 4.0 K/uL   Monocytes Relative 5 3 - 12 %   Monocytes Absolute 0.3 0.1 - 1.0 K/uL   Eosinophils Relative 0 0 - 5 %   Eosinophils Absolute 0.0 0.0 - 0.7 K/uL   Basophils Relative 0 0 - 1 %   Basophils Absolute 0.0 0.0 - 0.1 K/uL  Comprehensive metabolic panel     Status: Abnormal   Collection Time: 08/17/14  3:15 PM  Result Value Ref Range   Sodium 137 135 - 145 mmol/L   Potassium 4.1 3.5 - 5.1 mmol/L   Chloride 103 101 - 111 mmol/L   CO2 23 22 - 32 mmol/L   Glucose, Bld 122 (H) 65 - 99 mg/dL   BUN 13 6 - 20 mg/dL   Creatinine, Ser 0.86 0.44 - 1.00 mg/dL   Calcium 9.3 8.9 - 57.8 mg/dL   Total Protein 7.4 6.5 - 8.1 g/dL   Albumin 3.8 3.5 - 5.0 g/dL   AST 24 15 - 41 U/L   ALT 17 14 - 54 U/L   Alkaline Phosphatase 54 38 - 126 U/L   Total Bilirubin 0.5 0.3 - 1.2 mg/dL   GFR calc non Af Amer >60 >60 mL/min   GFR calc Af Amer >60 >60 mL/min   Anion gap 11 5 - 15  Lipase, blood     Status: None   Collection Time: 08/17/14  3:15 PM  Result Value Ref Range   Lipase 24 22 - 51 U/L  POC Urine Pregnancy, ED  (If Pre-menopausal female)  not at [facility]     Status: None   Collection Time: 08/17/14  3:35 PM  Result Value Ref Range   Preg Test, Ur NEGATIVE NEGATIVE  Wet prep, genital     Status: None   Collection Time: 08/17/14  6:10 PM  Result Value Ref Range   Yeast Wet Prep HPF POC NONE SEEN NONE SEEN   Trich, Wet Prep NONE SEEN NONE SEEN   Clue Cells Wet Prep HPF POC NONE SEEN NONE SEEN   WBC, Wet Prep HPF POC NONE SEEN NONE SEEN   Recent Results (from the past 240 hour(s))  Wet  prep, genital     Status: None   Collection Time: 08/17/14  6:10 PM  Result Value Ref Range Status   Yeast Wet Prep HPF POC NONE SEEN NONE SEEN Final   Trich, Wet Prep NONE SEEN NONE SEEN Final   Clue Cells Wet Prep HPF POC NONE SEEN NONE SEEN Final   WBC, Wet Prep HPF POC NONE SEEN NONE SEEN Final   Creatinine:  Recent Labs  08/17/14 1515  CREATININE 0.73    Impression/Assessment:  R ureteral stone, right hydronephrosis  Plan:  1. Risks/benefits/alternatives to right ureteroscopic stone manipulation was explained to the patient and family and they understand and wish to proceed with surgery  Athenia Rys L 08/18/2014, 12:30 PM

## 2014-08-18 NOTE — Op Note (Signed)
.  Preoperative diagnosis: Right renal stone  Postoperative diagnosis: Same  Procedure: 1 cystoscopy 2 right retrograde pyelography 3.  Intraoperative fluoroscopy, under one hour, with interpretation 4.  Right ureteroscopic stone manipulation with laser lithotripsy 5.  Right 6 x 26 JJ stent placement  Attending: Cleda MccreedyPatrick Mackenzie  Anesthesia: General  Estimated blood loss: None  Drains: Right 6 x 26 JJ ureteral stent without tether  Specimens: stone  Antibiotics: Ancef  Findings: Right UVJ stone, impacted. Severe right hydroureteronephrosis. No masses/lesions in the bladder. Ureteral orifices in normal anatomic location.  Indications: Patient is a 29 year old female with a history of right distal ureteral stone and who has persistent right flank pain.  After discussing treatment options, she decided proceed with right ureteroscopic stone manipulation.  Procedure her in detail: The patient was brought to the operating room and a brief timeout was done to ensure correct patient, correct procedure, correct site.  General anesthesia was administered patient was placed in dorsal lithotomy position.  Her genitalia was then prepped and draped in usual sterile fashion.  A rigid 22 French cystoscope was passed in the urethra and the bladder.  Bladder was inspected free masses or lesions.  the right ureteral orifices were in the normal orthotopic locations.  a 6 french ureteral catheter was then instilled into the right ureter orifice.  a gentle retrograde was obtained and findings noted above.  we then placed a zip wire through the ureteral catheter and advanced up to the renal pelvis.  we then removed the cystoscope and cannulated the right ureteral orifice with a semirigid ureteroscope. We encountered the stone at the UVJ and using a 200nm laser fiber the stone was fragmented. The fragments were then removed with an Ngage basket. We the performed ureteroscopy and no residual stone was found in the  ureter. Once we reached the UPJ a sensor wire was advanced in to the renal pelvis. We then removed the ureteroscope and advanced a 12/14x 38cm access sheath up to the UPJ.  We then used the flexible ureteroscope to perform nephroscopy. We encountered the stone in the renal pelvis.  using an Ngage basket the stone was removed.  The access sheath was then removed under direct vision. We then placed a 6x26 JJ stent over the zip wire. The wire was then removed and good coil was noted in the the renal pelvis under fluoroscopy and the bladder under direct vision.  the stone fragments were then removed from the bladder and sent for analysis.   the bladder was then drained and this concluded the procedure which was well tolerated by patient.  Complications: None  Condition: Stable, extubated, transferred to PACU  Plan: Patient is to be discharged home as to follow-up in 2 weeks for stent removal.

## 2014-08-18 NOTE — Anesthesia Procedure Notes (Signed)
Procedure Name: LMA Insertion Date/Time: 08/18/2014 2:49 PM Performed by: Jessica PriestBEESON, Clancy Mullarkey C Pre-anesthesia Checklist: Patient identified, Emergency Drugs available, Suction available and Patient being monitored Patient Re-evaluated:Patient Re-evaluated prior to inductionOxygen Delivery Method: Circle System Utilized Preoxygenation: Pre-oxygenation with 100% oxygen Intubation Type: IV induction Ventilation: Mask ventilation without difficulty LMA: LMA inserted LMA Size: 4.0 Number of attempts: 1 Airway Equipment and Method: Bite block Placement Confirmation: positive ETCO2 Tube secured with: Tape Dental Injury: Teeth and Oropharynx as per pre-operative assessment

## 2014-08-18 NOTE — Anesthesia Preprocedure Evaluation (Signed)
Anesthesia Evaluation  Patient identified by MRN, date of birth, ID band Patient awake    Reviewed: Allergy & Precautions, H&P , NPO status , Patient's Chart, lab work & pertinent test results, reviewed documented beta blocker date and time , Unable to perform ROS - Chart review only  History of Anesthesia Complications Negative for: history of anesthetic complications  Airway Mallampati: I  TM Distance: >3 FB Neck ROM: full    Dental no notable dental hx.    Pulmonary neg pulmonary ROS,  breath sounds clear to auscultation  Pulmonary exam normal       Cardiovascular negative cardio ROS Normal cardiovascular examRhythm:regular Rate:Normal     Neuro/Psych negative neurological ROS  negative psych ROS   GI/Hepatic negative GI ROS, Neg liver ROS,   Endo/Other  negative endocrine ROS  Renal/GU Renal disease     Musculoskeletal   Abdominal   Peds  Hematology negative hematology ROS (+)   Anesthesia Other Findings NPO appropriate, no meds today, allergies reviewed Denies active cardiac or pulmonary symptoms, METS > 4 No recent congestive cough or symptoms of upper respiratory infection   Reproductive/Obstetrics negative OB ROS                             Anesthesia Physical Anesthesia Plan  ASA: II  Anesthesia Plan: General LMA   Post-op Pain Management:    Induction:   Airway Management Planned:   Additional Equipment:   Intra-op Plan:   Post-operative Plan:   Informed Consent: I have reviewed the patients History and Physical, chart, labs and discussed the procedure including the risks, benefits and alternatives for the proposed anesthesia with the patient or authorized representative who has indicated his/her understanding and acceptance.   Dental Advisory Given  Plan Discussed with: Anesthesiologist  Anesthesia Plan Comments:         Anesthesia Quick Evaluation

## 2014-08-19 ENCOUNTER — Encounter (HOSPITAL_BASED_OUTPATIENT_CLINIC_OR_DEPARTMENT_OTHER): Payer: Self-pay | Admitting: Urology

## 2014-08-26 ENCOUNTER — Encounter: Payer: Self-pay | Admitting: Urgent Care

## 2014-08-26 DIAGNOSIS — N390 Urinary tract infection, site not specified: Secondary | ICD-10-CM

## 2016-01-16 IMAGING — US US OB COMP LESS 14 WK
1 series · 14 of 28 positions shown · non-contrast
Comparison: None.

CLINICAL DATA: Vaginal bleeding beginning 05/02/2014. Early
pregnancy. Patient unsure of LMP.

EXAM:
OBSTETRIC <14 WK US AND TRANSVAGINAL OB US
TECHNIQUE: Both transabdominal and transvaginal ultrasound examinations were
performed for complete evaluation of the gestation as well as the
maternal uterus, adnexal regions, and pelvic cul-de-sac.
Transvaginal technique was performed to assess early pregnancy.

[Series 1: us ob comp less 14 wks · 54 acquisitions, 14 frames shown]
[im 2/54]
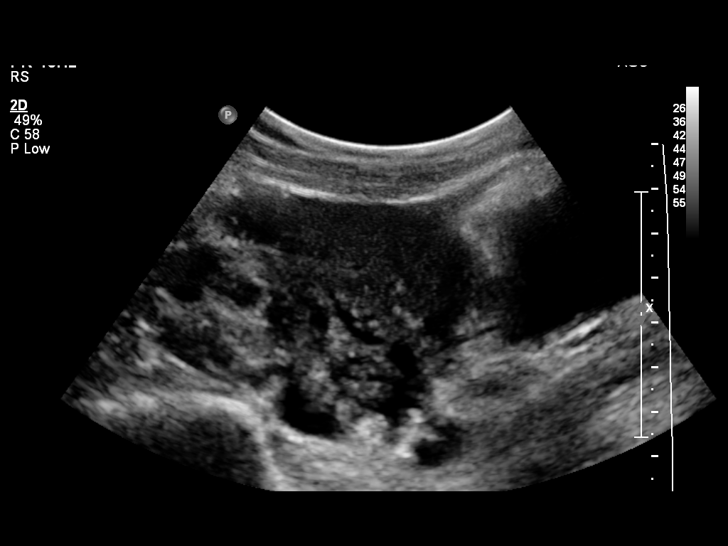
[im 6/54]
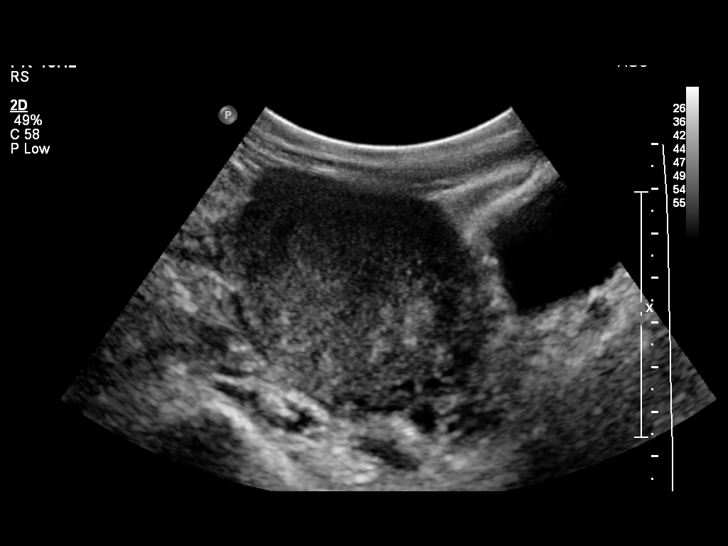
[im 10/54]
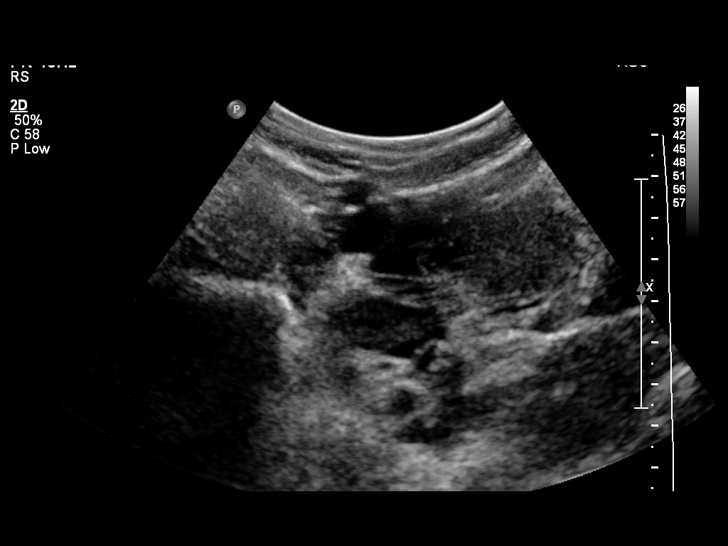
[im 14/54]
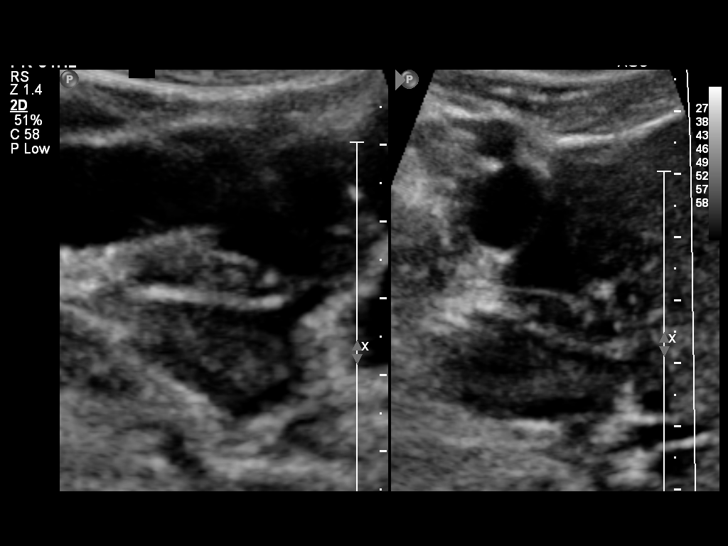
[im 18/54]
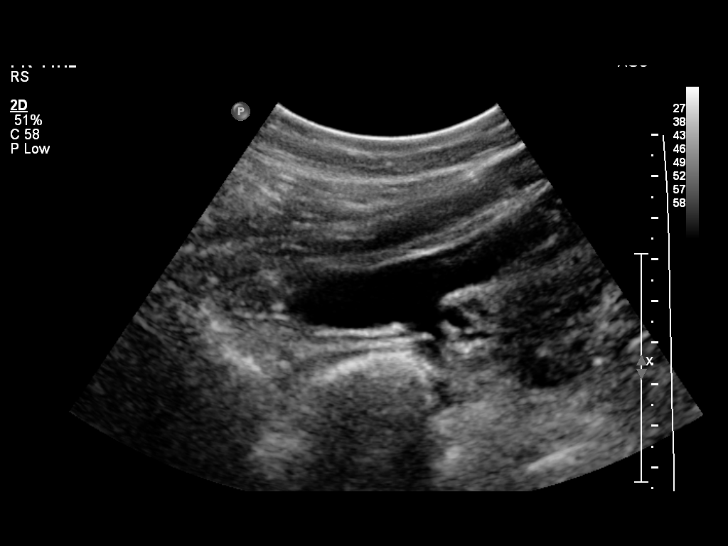
[im 22/54]
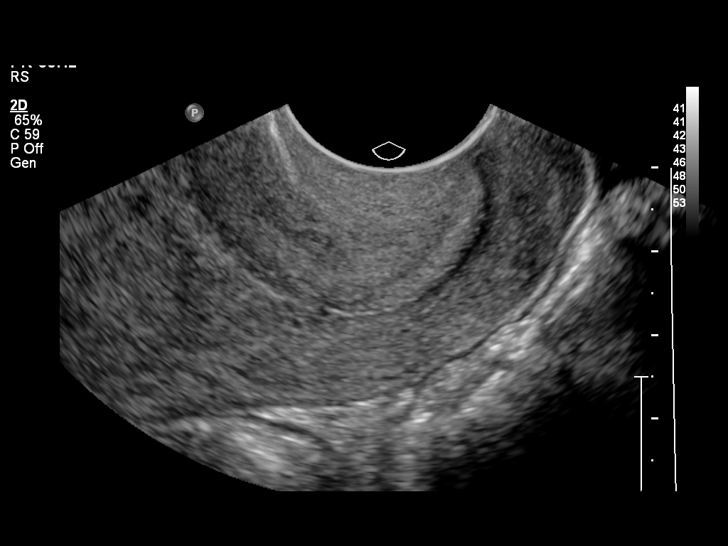
[im 26/54]
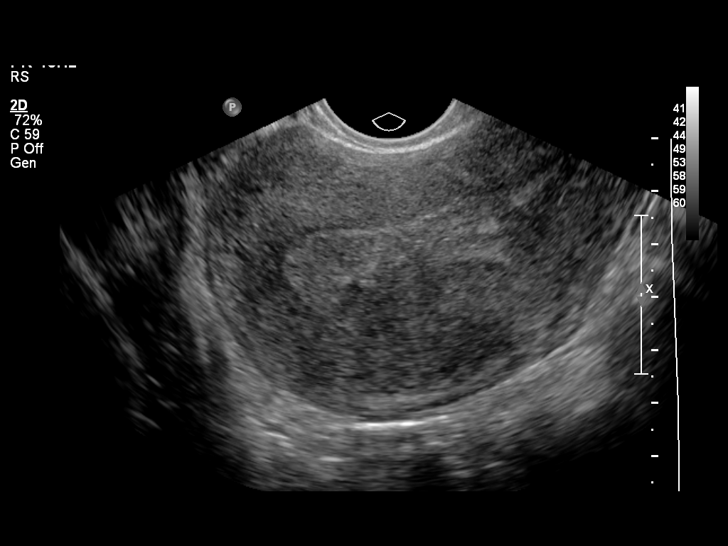
[im 30/54]
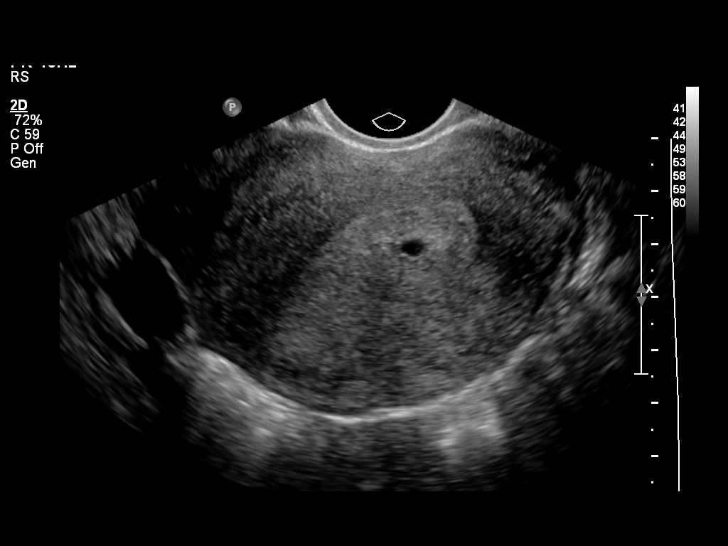
[im 34/54]
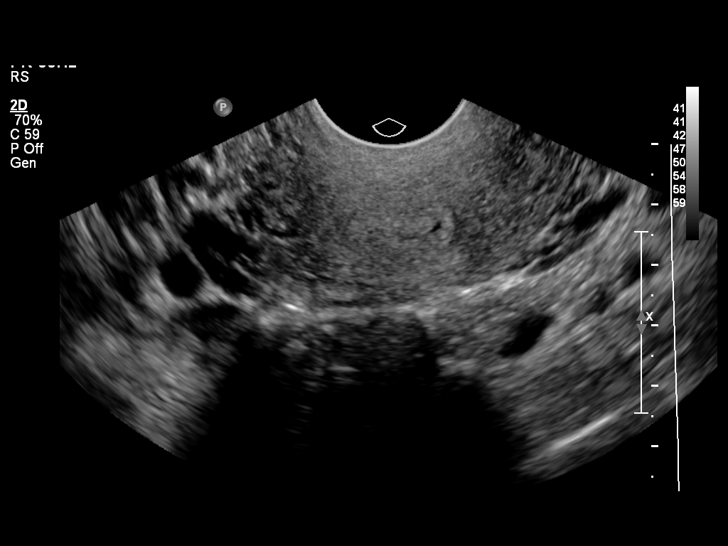
[im 38/54]
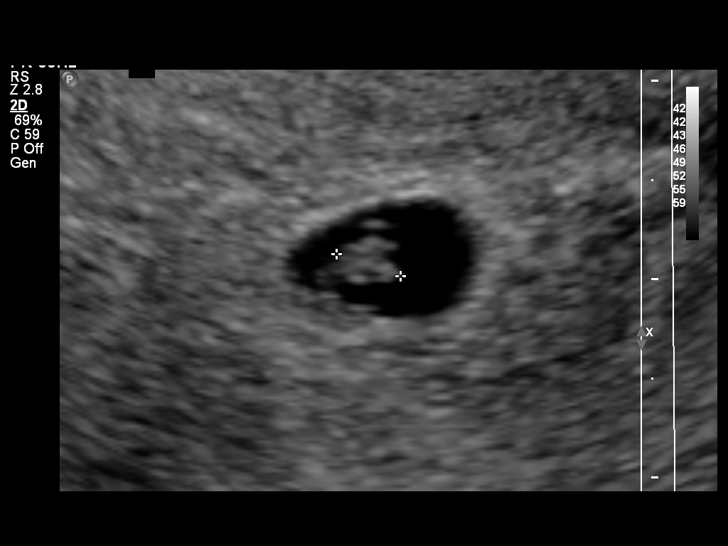
[im 42/54]
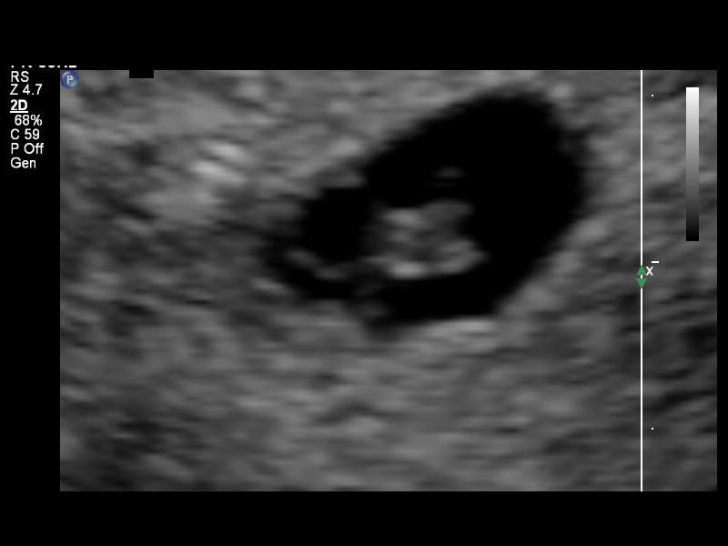
[im 46/54]
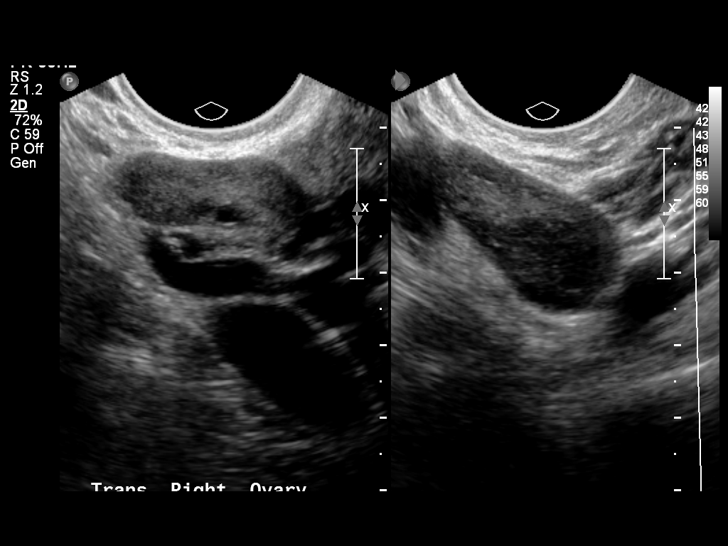
[im 50/54]
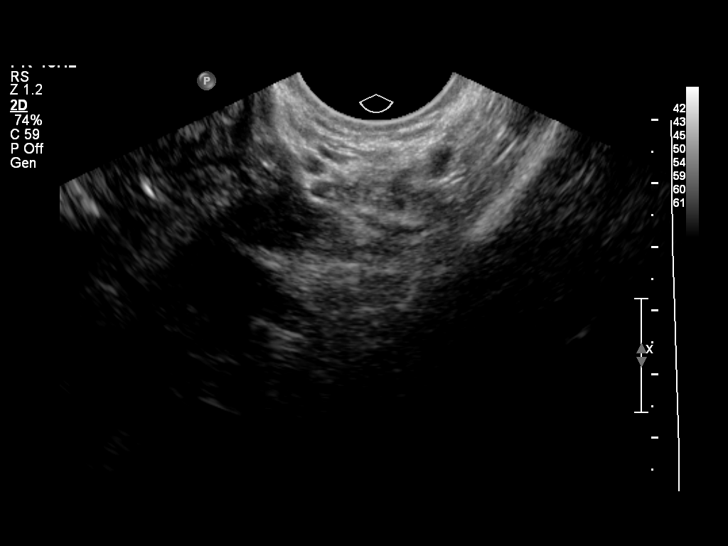
[im 54/54]
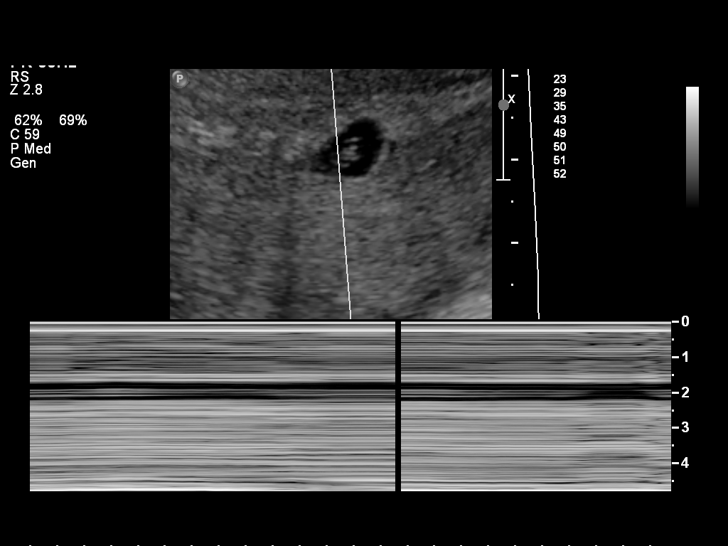

[14 of 28 positions shown; findings below may reference images not displayed]

FINDINGS: Intrauterine gestational sac: Present. Small in size in comparison
with embryo size.

Yolk sac:  Present.

Embryo:  Present.

Cardiac Activity: Present.

Heart Rate: Heart rate appeared too slow to accurately measure with
M-mode imaging.

CRL:  3.8  mm   6 w   1 d                  US EDC: 12/28/2014

Maternal uterus/adnexae: Small amount of subchorionic hemorrhage in
small volume pelvic free fluid or noted. Ovaries were normal in
appearance, with a right ovarian corpus luteum noted.
IMPRESSION: Living intrauterine gestation as above. Gestational sac appears
small and cardiac activity, while present, appears abnormally slow.
Close clinical follow-up recommended with consideration for
short-term repeat ultrasound.

## 2016-04-29 IMAGING — CT CT ABD-PELV W/ CM
2 of 4 series · 16 of 46 positions shown, 18 images · IV contrast (omnipaque)
Comparison: None.

CLINICAL DATA: Severe right flank pain and back pain with nausea
and vomiting. Symptoms began today.

EXAM:
CT ABDOMEN AND PELVIS WITH CONTRAST
TECHNIQUE: Multidetector CT imaging of the abdomen and pelvis was performed
using the standard protocol following bolus administration of
intravenous contrast.
CONTRAST:  100mL OMNIPAQUE IOHEXOL 300 MG/ML  SOLN

[Series 2: abd/ pelvis 5.0 i30f 1 · axial · 0.61mm/px · z∈[+945,+1345]mm · 13 of 88 slices shown, 15 images]
[im 4/88  soft-tissue]
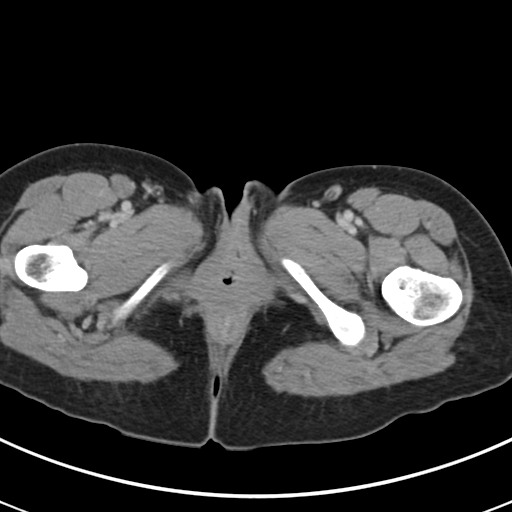
[im 4/88  bone]
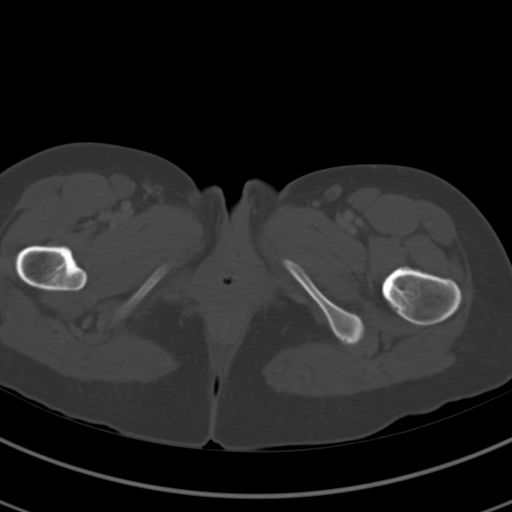
[im 11/88  soft-tissue]
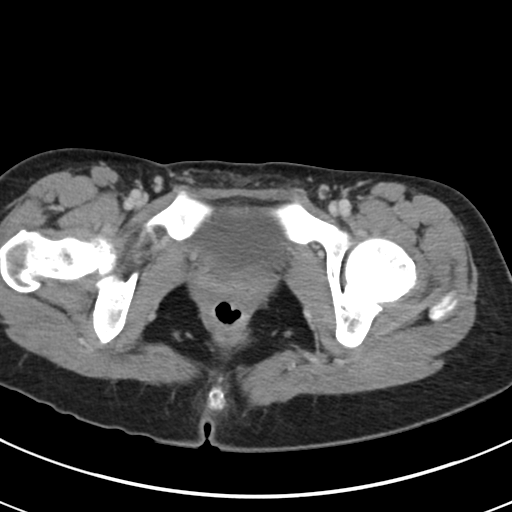
[im 19/88  soft-tissue]
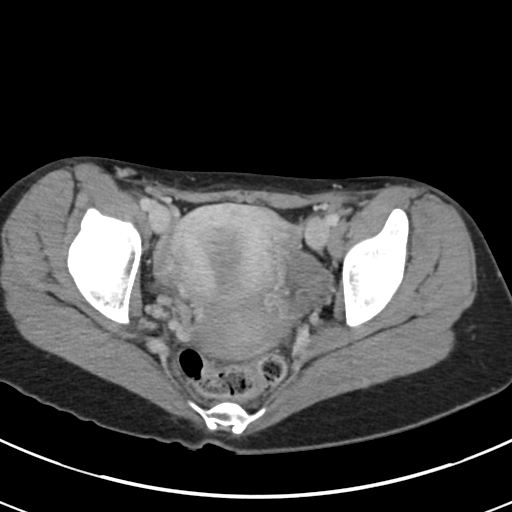
[im 26/88  soft-tissue]
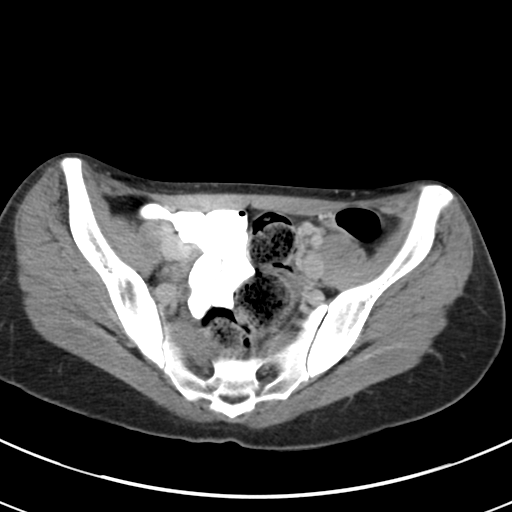
[im 30/88  soft-tissue]
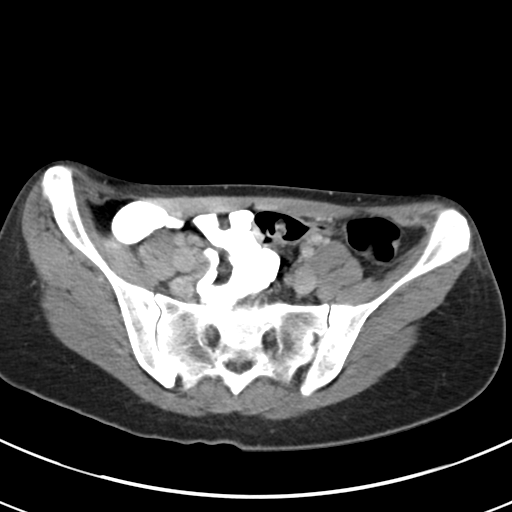
[im 37/88  soft-tissue]
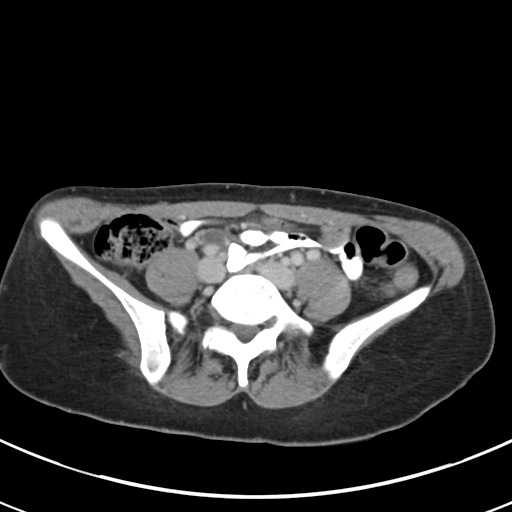
[im 44/88  soft-tissue]
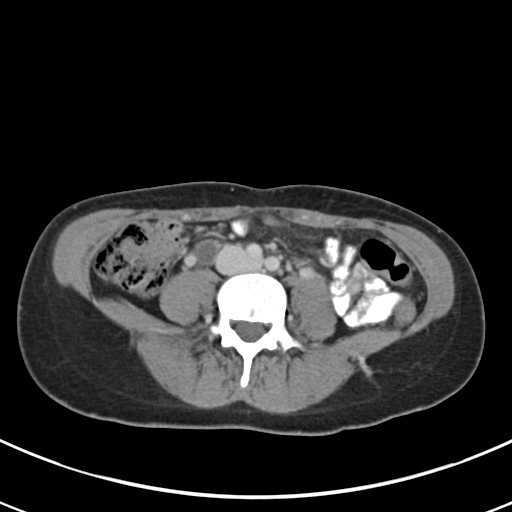
[im 51/88  soft-tissue]
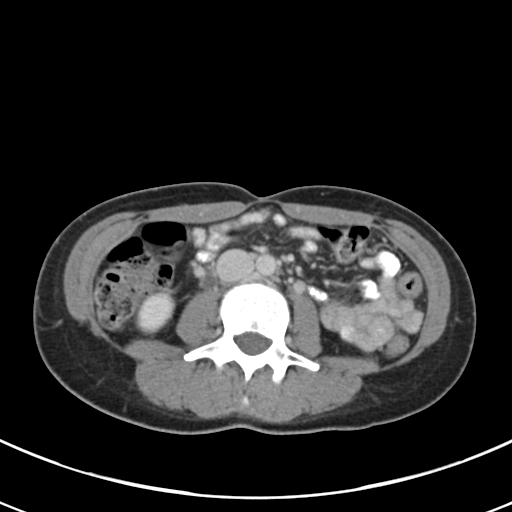
[im 59/88  soft-tissue]
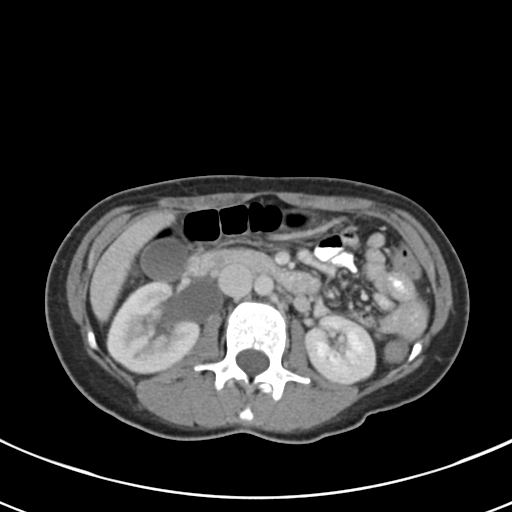
[im 59/88  bone]
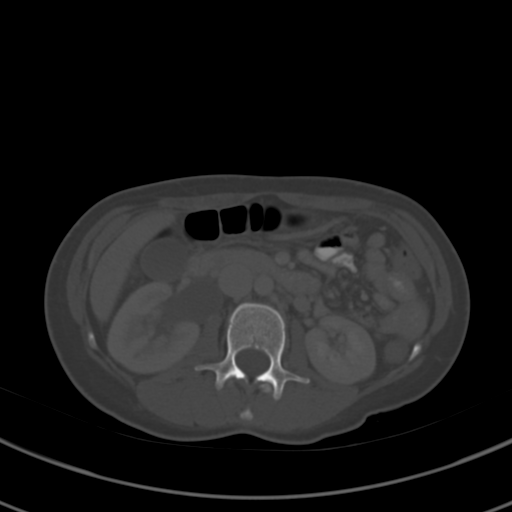
[im 62/88  soft-tissue]
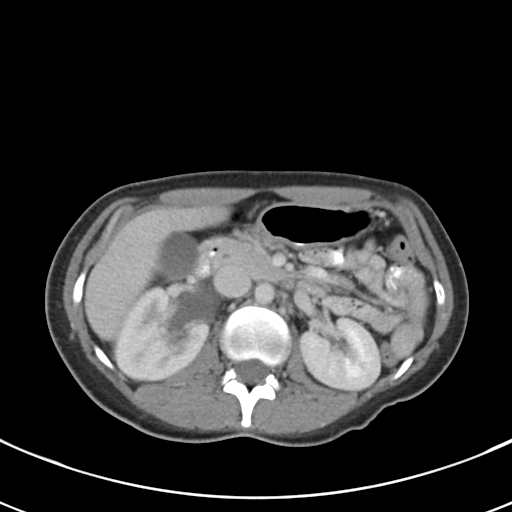
[im 69/88  soft-tissue]
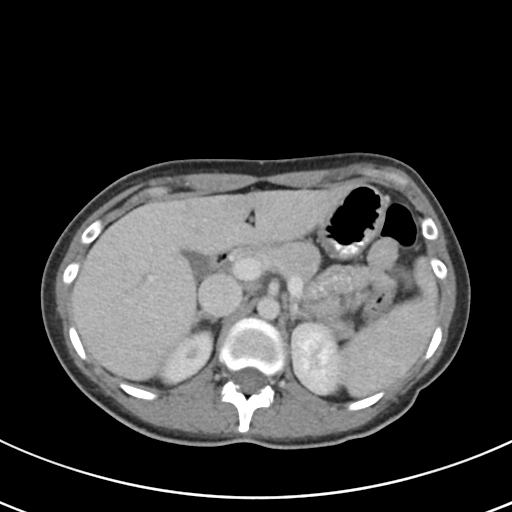
[im 77/88  soft-tissue]
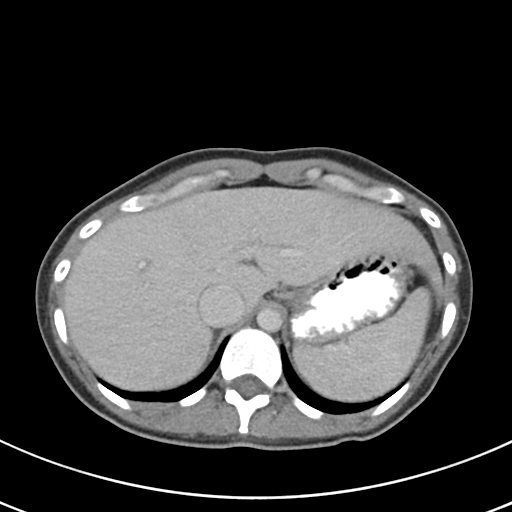
[im 84/88  soft-tissue]
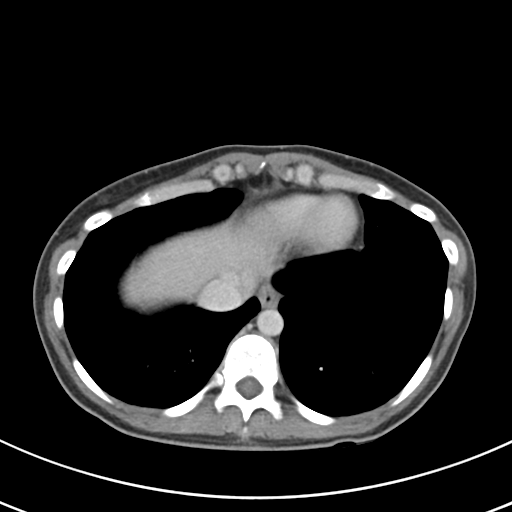

[Series 5: coronals · coronal · 0.56mm/px · 3 of 90 slices shown]
[im 30/90  soft-tissue]
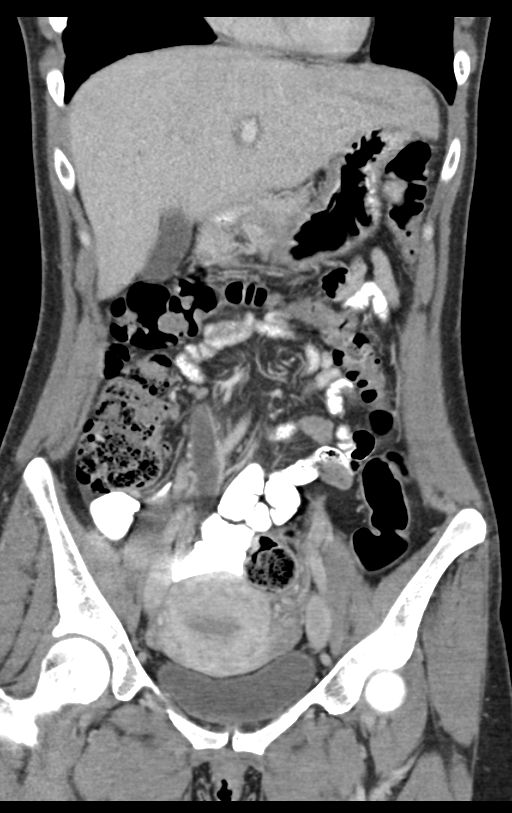
[im 40/90  soft-tissue]
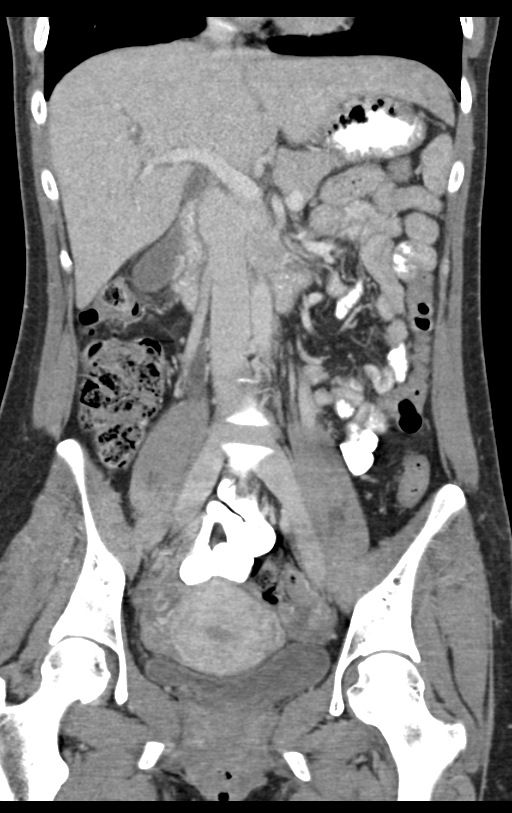
[im 50/90  soft-tissue]
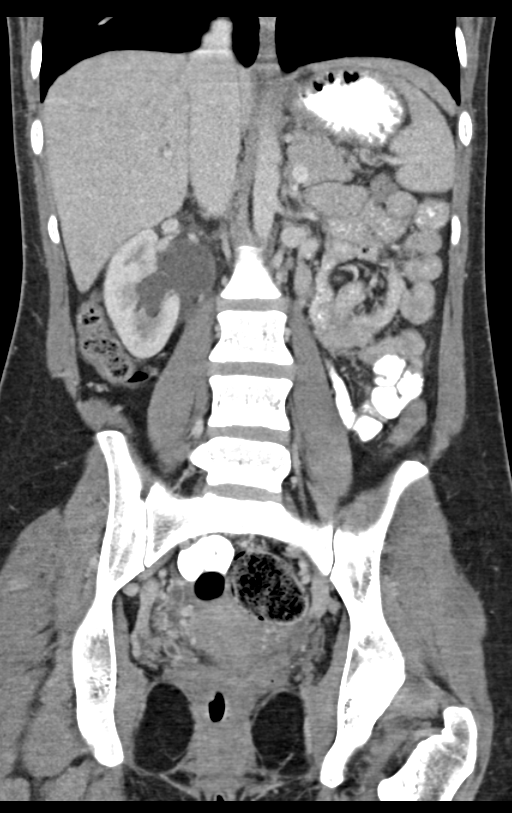

[16 of 46 positions shown; findings below may reference images not displayed]

FINDINGS: Lung bases are clear. No pleural or pericardial fluid. The liver has
a normal appearance without focal lesions or biliary ductal
dilatation. No calcified gallstones. The spleen is normal. The
pancreas is normal. The adrenal glands are normal. The left kidney
is normal. No cyst, mass, stone or hydronephrosis. There is
hydroureteronephrosis of the right kidney with the ureter dilated
all the way to the bladder. There is a 7 mm stone in the distal
right ureter responsible for the obstruction. No stone in the
bladder. Uterus and adnexal regions are normal. No bowel pathology
is seen.
IMPRESSION: Hydroureteronephrosis on the right due to a 7 mm stone at the right
UVJ.

## 2017-08-12 DIAGNOSIS — D649 Anemia, unspecified: Secondary | ICD-10-CM

## 2017-08-12 DIAGNOSIS — Z01818 Encounter for other preprocedural examination: Secondary | ICD-10-CM

## 2017-08-12 DIAGNOSIS — E8770 Fluid overload, unspecified: Secondary | ICD-10-CM

## 2017-08-12 DIAGNOSIS — O1495 Unspecified pre-eclampsia, complicating the puerperium: Secondary | ICD-10-CM

## 2017-08-12 DIAGNOSIS — E871 Hypo-osmolality and hyponatremia: Secondary | ICD-10-CM | POA: Diagnosis not present

## 2017-08-12 DIAGNOSIS — R748 Abnormal levels of other serum enzymes: Secondary | ICD-10-CM

## 2017-08-12 DIAGNOSIS — R51 Headache: Secondary | ICD-10-CM

## 2017-08-13 DIAGNOSIS — R748 Abnormal levels of other serum enzymes: Secondary | ICD-10-CM | POA: Diagnosis not present

## 2017-08-13 DIAGNOSIS — R51 Headache: Secondary | ICD-10-CM | POA: Diagnosis not present

## 2017-08-13 DIAGNOSIS — D649 Anemia, unspecified: Secondary | ICD-10-CM | POA: Diagnosis not present

## 2017-08-13 DIAGNOSIS — E871 Hypo-osmolality and hyponatremia: Secondary | ICD-10-CM | POA: Diagnosis not present

## 2019-05-03 ENCOUNTER — Telehealth: Payer: Self-pay | Admitting: Adult Health

## 2019-05-03 NOTE — Telephone Encounter (Signed)
Pt returning a call she received from [facility]. Please call back at [phone]

## 2019-05-03 NOTE — Telephone Encounter (Signed)
Rene Kocher is out of the office on Friday's will return on Monday to review messages.

## 2019-05-06 NOTE — Telephone Encounter (Signed)
Did not call this patient that I'm aware of. I don't think I have seen her here.
